# Patient Record
Sex: Female | Born: 1960 | Race: White | Hispanic: No | Smoking: Former smoker
Health system: Southern US, Community
[De-identification: ages and names within clinical notes are randomized; demographics above are authoritative.]

## PROBLEM LIST (undated history)

## (undated) DIAGNOSIS — D649 Anemia, unspecified: Secondary | ICD-10-CM

## (undated) DIAGNOSIS — Z808 Family history of malignant neoplasm of other organs or systems: Secondary | ICD-10-CM

## (undated) DIAGNOSIS — F32A Depression, unspecified: Secondary | ICD-10-CM

## (undated) DIAGNOSIS — Z5189 Encounter for other specified aftercare: Secondary | ICD-10-CM

## (undated) DIAGNOSIS — I10 Essential (primary) hypertension: Secondary | ICD-10-CM

## (undated) DIAGNOSIS — Z801 Family history of malignant neoplasm of trachea, bronchus and lung: Secondary | ICD-10-CM

## (undated) DIAGNOSIS — E162 Hypoglycemia, unspecified: Secondary | ICD-10-CM

## (undated) DIAGNOSIS — F419 Anxiety disorder, unspecified: Secondary | ICD-10-CM

## (undated) DIAGNOSIS — Z803 Family history of malignant neoplasm of breast: Secondary | ICD-10-CM

## (undated) DIAGNOSIS — Z800 Family history of malignant neoplasm of digestive organs: Secondary | ICD-10-CM

## (undated) DIAGNOSIS — Z8043 Family history of malignant neoplasm of testis: Secondary | ICD-10-CM

## (undated) DIAGNOSIS — H269 Unspecified cataract: Secondary | ICD-10-CM

## (undated) DIAGNOSIS — F329 Major depressive disorder, single episode, unspecified: Secondary | ICD-10-CM

## (undated) HISTORY — DX: Family history of malignant neoplasm of trachea, bronchus and lung: Z80.1

## (undated) HISTORY — DX: Unspecified cataract: H26.9

## (undated) HISTORY — DX: Encounter for other specified aftercare: Z51.89

## (undated) HISTORY — DX: Anemia, unspecified: D64.9

## (undated) HISTORY — DX: Anxiety disorder, unspecified: F41.9

## (undated) HISTORY — DX: Family history of malignant neoplasm of breast: Z80.3

## (undated) HISTORY — DX: Family history of malignant neoplasm of testis: Z80.43

## (undated) HISTORY — DX: Depression, unspecified: F32.A

## (undated) HISTORY — DX: Major depressive disorder, single episode, unspecified: F32.9

## (undated) HISTORY — DX: Family history of malignant neoplasm of digestive organs: Z80.0

## (undated) HISTORY — DX: Family history of malignant neoplasm of other organs or systems: Z80.8

## (undated) HISTORY — PX: TUBAL LIGATION: SHX77

## (undated) HISTORY — PX: FRACTURE SURGERY: SHX138

---

## 1985-09-08 HISTORY — PX: SPLENECTOMY, TOTAL: SHX788

## 2004-03-01 ENCOUNTER — Emergency Department (HOSPITAL_COMMUNITY): Admission: EM | Admit: 2004-03-01 | Discharge: 2004-03-01 | Payer: Self-pay | Admitting: Family Medicine

## 2004-05-07 ENCOUNTER — Ambulatory Visit (HOSPITAL_COMMUNITY): Admission: RE | Admit: 2004-05-07 | Discharge: 2004-05-07 | Payer: Self-pay | Admitting: Family Medicine

## 2004-05-14 ENCOUNTER — Ambulatory Visit (HOSPITAL_COMMUNITY): Admission: RE | Admit: 2004-05-14 | Discharge: 2004-05-14 | Payer: Self-pay | Admitting: Internal Medicine

## 2007-05-06 ENCOUNTER — Emergency Department (HOSPITAL_COMMUNITY): Admission: EM | Admit: 2007-05-06 | Discharge: 2007-05-06 | Payer: Self-pay | Admitting: Emergency Medicine

## 2009-06-14 ENCOUNTER — Ambulatory Visit: Payer: Self-pay | Admitting: Obstetrics and Gynecology

## 2009-06-14 ENCOUNTER — Encounter: Payer: Self-pay | Admitting: Obstetrics and Gynecology

## 2009-06-14 LAB — CONVERTED CEMR LAB
FSH: 49.5 milliintl units/mL
HCT: 25.8 % — ABNORMAL LOW (ref 36.0–46.0)
Hemoglobin: 7.2 g/dL — CL (ref 12.0–15.0)
RBC: 3.32 M/uL — ABNORMAL LOW (ref 3.87–5.11)

## 2009-06-15 ENCOUNTER — Ambulatory Visit (HOSPITAL_COMMUNITY): Admission: RE | Admit: 2009-06-15 | Discharge: 2009-06-15 | Payer: Self-pay | Admitting: Obstetrics & Gynecology

## 2009-07-03 ENCOUNTER — Ambulatory Visit (HOSPITAL_COMMUNITY): Admission: RE | Admit: 2009-07-03 | Discharge: 2009-07-03 | Payer: Self-pay | Admitting: Obstetrics & Gynecology

## 2009-07-05 ENCOUNTER — Ambulatory Visit: Payer: Self-pay | Admitting: Obstetrics and Gynecology

## 2010-07-28 ENCOUNTER — Emergency Department (HOSPITAL_COMMUNITY): Admission: EM | Admit: 2010-07-28 | Discharge: 2010-07-28 | Payer: Self-pay | Admitting: Emergency Medicine

## 2010-12-12 LAB — POCT URINALYSIS DIP (DEVICE)
Glucose, UA: NEGATIVE mg/dL
Ketones, ur: NEGATIVE mg/dL
Nitrite: NEGATIVE
Nitrite: POSITIVE — AB
Protein, ur: NEGATIVE mg/dL
Protein, ur: NEGATIVE mg/dL
Specific Gravity, Urine: 1.02 (ref 1.005–1.030)
Urobilinogen, UA: 1 mg/dL (ref 0.0–1.0)
pH: 6 (ref 5.0–8.0)

## 2010-12-15 ENCOUNTER — Other Ambulatory Visit: Payer: Self-pay | Admitting: Family Medicine

## 2010-12-15 ENCOUNTER — Ambulatory Visit (HOSPITAL_BASED_OUTPATIENT_CLINIC_OR_DEPARTMENT_OTHER)
Admission: RE | Admit: 2010-12-15 | Discharge: 2010-12-15 | Disposition: A | Discharge status: Home / Self Care | Payer: Self-pay | Source: Ambulatory Visit | Attending: Family Medicine | Admitting: Family Medicine

## 2010-12-15 ENCOUNTER — Ambulatory Visit (INDEPENDENT_AMBULATORY_CARE_PROVIDER_SITE_OTHER)
Admission: RE | Admit: 2010-12-15 | Discharge: 2010-12-15 | Disposition: A | Discharge status: Home / Self Care | Payer: Self-pay | Source: Ambulatory Visit | Attending: Family Medicine | Admitting: Family Medicine

## 2010-12-15 DIAGNOSIS — K37 Unspecified appendicitis: Secondary | ICD-10-CM

## 2010-12-15 DIAGNOSIS — J984 Other disorders of lung: Secondary | ICD-10-CM | POA: Insufficient documentation

## 2010-12-15 DIAGNOSIS — R188 Other ascites: Secondary | ICD-10-CM | POA: Insufficient documentation

## 2010-12-15 MED ORDER — IOHEXOL 300 MG/ML  SOLN
100.0000 mL | Freq: Once | INTRAMUSCULAR | Status: AC | PRN
  Administered 2010-12-15: 100 mL via INTRAVENOUS

## 2012-06-03 ENCOUNTER — Ambulatory Visit: Payer: Self-pay | Admitting: Family Medicine

## 2012-06-03 VITALS — BP 151/100 | HR 81 | Temp 98.0°F | Resp 18 | Ht 60.0 in | Wt 149.0 lb

## 2012-06-03 DIAGNOSIS — M545 Low back pain, unspecified: Secondary | ICD-10-CM

## 2012-06-03 DIAGNOSIS — M549 Dorsalgia, unspecified: Secondary | ICD-10-CM

## 2012-06-03 MED ORDER — CYCLOBENZAPRINE HCL 10 MG PO TABS: 10.0000 mg | ORAL_TABLET | Freq: Three times a day (TID) | ORAL | Status: DC | PRN

## 2012-06-03 NOTE — Patient Instructions (Addendum)
Keep using the Alleve twice a day for the next several days.   Take the Flexeril at night for relief of your back.   Use heat and massage for relief as well.    Come back to see [place] in 10 - 14 days if you're still having issues, sooner if you're worsening.    Back Pain, Adult Low back pain is very common. About 1 in 5 people have back pain.The cause of low back pain is rarely dangerous. The pain often gets better over time.About half of people with a sudden onset of back pain feel better in just 2 weeks. About 8 in 10 people feel better by 6 weeks.  CAUSES Some common causes of back pain include:  Strain of the muscles or ligaments supporting the spine.   Wear and tear (degeneration) of the spinal discs.   Arthritis.   Direct injury to the back.  DIAGNOSIS Most of the time, the direct cause of low back pain is not known.However, back pain can be treated effectively even when the exact cause of the pain is unknown.Answering your caregiver's questions about your overall health and symptoms is one of the most accurate ways to make sure the cause of your pain is not dangerous. If your caregiver needs more information, he or she may order lab work or imaging tests (X-rays or MRIs).However, even if imaging tests show changes in your back, this usually does not require surgery. HOME CARE INSTRUCTIONS For many people, back pain returns.Since low back pain is rarely dangerous, it is often a condition that people can learn to [facility] their own.   Remain active. It is stressful on the back to sit or stand in one place. Do not sit, drive, or stand in one place for more than 30 minutes at a time. Take short walks on level surfaces as soon as pain allows.Try to increase the length of time you walk each day.   Do not stay in bed.Resting more than 1 or 2 days can delay your recovery.   Do not avoid exercise or work.Your body is made to move.It is not dangerous to be active, even though your  back may hurt.Your back will likely heal faster if you return to being active before your pain is gone.   Pay attention to your body when you bend and lift. Many people have less discomfortwhen lifting if they bend their knees, keep the load close to their bodies,and avoid twisting. Often, the most comfortable positions are those that put less stress on your recovering back.   Find a comfortable position to sleep. Use a firm mattress and lie on your side with your knees slightly bent. If you lie on your back, put a pillow under your knees.   Only take over-the-counter or prescription medicines as directed by your caregiver. Over-the-counter medicines to reduce pain and inflammation are often the most helpful.Your caregiver may prescribe muscle relaxant drugs.These medicines help dull your pain so you can more quickly return to your normal activities and healthy exercise.   Put ice on the injured area.   Put ice in a plastic bag.   Place a towel between your skin and the bag.   Leave the ice on for 15 to 20 minutes, 3 to 4 times a day for the first 2 to 3 days. After that, ice and heat may be alternated to reduce pain and spasms.   Ask your caregiver about trying back exercises and gentle massage. This may be  of some benefit.   Avoid feeling anxious or stressed.Stress increases muscle tension and can worsen back pain.It is important to recognize when you are anxious or stressed and learn ways to manage it.Exercise is a great option.  SEEK MEDICAL CARE IF:  You have pain that is not relieved with rest or medicine.   You have pain that does not improve in 1 week.   You have new symptoms.   You are generally not feeling well.  SEEK IMMEDIATE MEDICAL CARE IF:   You have pain that radiates from your back into your legs.   You develop new bowel or bladder control problems.   You have unusual weakness or numbness in your arms or legs.   You develop nausea or vomiting.   You  develop abdominal pain.   You feel faint.  Document Released: 08/25/2005 Document Revised: 08/14/2011 Document Reviewed: 01/13/2011 [facility] Patient Information 2012 [place], [place].
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
## 2013-08-21 ENCOUNTER — Ambulatory Visit (INDEPENDENT_AMBULATORY_CARE_PROVIDER_SITE_OTHER): Payer: BC Managed Care – PPO | Admitting: Emergency Medicine

## 2013-08-21 VITALS — BP 168/78 | HR 101 | Temp 98.7°F | Resp 30 | Ht 60.5 in | Wt 153.0 lb

## 2013-08-21 DIAGNOSIS — J209 Acute bronchitis, unspecified: Secondary | ICD-10-CM

## 2013-08-21 MED ORDER — HYDROCOD POLST-CHLORPHEN POLST 10-8 MG/5ML PO LQCR: 5.0000 mL | Freq: Two times a day (BID) | ORAL | Status: DC | PRN

## 2013-08-21 NOTE — Patient Instructions (Signed)

## 2013-08-21 NOTE — Progress Notes (Signed)
[facility] [address], [place] [address] [phone]  Date:  08/21/2013   Name:  Melanie Montgomery   DOB:  August 28, 1961   MRN:  [id number]  PCP:  No primary provider on file.    Chief Complaint: Cough and Generalized Body Aches   History of Present Illness:  Melanie Montgomery is a 52 y.o. very pleasant female patient who presents with the following:  Ill for a week with cough and nasal congestion.  Initially mucopurulent sputum but now clear.  Has clear nasal drainage.  No fever or chills. No wheezing or shortness of breath.  No nausea or vomiting.  Had sore throat now resolved. No flu shot.  no myalgias or arthralgias.  No headache.  Cough is quite intense.  No improvement with over the counter medications or other home remedies. Denies other complaint or health concern today.   There are no active problems to display for this patient.   Past Medical History  Diagnosis Date  . Anemia   . Blood transfusion without reported diagnosis     Past Surgical History  Procedure Laterality Date  . Splenectomy, total  1987  . Fracture surgery      History  Substance Use Topics  . Smoking status: Former Smoker -- 2.00 packs/day    Types: Cigarettes    Start date: 08/21/1981    Quit date: 01/02/2013  . Smokeless tobacco: Never Used  . Alcohol Use: Not on file    Family History  Problem Relation Age of Onset  . Cancer Mother   . Diabetes Mother   . Heart disease Father   . Diabetes Sister   . Heart disease Brother   . Diabetes Brother   . Heart disease Brother     No Known Allergies  Medication list has been reviewed and updated.  Current Outpatient Prescriptions on File Prior to Visit  Medication Sig Dispense Refill  . cyclobenzaprine (FLEXERIL) 10 MG tablet Take 1 tablet (10 mg total) by mouth 3 (three) times daily as needed for muscle spasms.  20 tablet  0   No current facility-administered medications on file prior to visit.     Review of Systems:  As per HPI, otherwise negative.    Physical Examination: Filed Vitals:   08/21/13 1256  BP: 168/78  Pulse: 101  Temp: 98.7 F (37.1 C)  Resp: 30   Filed Vitals:   08/21/13 1256  Height: 5' 0.5" (1.537 m)  Weight: 153 lb (69.4 kg)   Body mass index is 29.38 kg/(m^2). Ideal Body Weight: Weight in (lb) to have BMI = 25: 129.9  GEN: WDWN, NAD, Non-toxic, A & O x 3 HEENT: Atraumatic, Normocephalic. Neck supple. No masses, No LAD. Ears and Nose: No external deformity. CV: RRR, No M/G/R. No JVD. No thrill. No extra heart sounds. PULM: CTA B, no wheezes, crackles, rhonchi. No retractions. No resp. distress. No accessory muscle use. ABD: S, NT, ND, +BS. No rebound. No HSM. EXTR: No c/c/e NEURO Normal gait.  PSYCH: Normally interactive. Conversant. Not depressed or anxious appearing.  Calm demeanor.    Assessment and Plan: Viral bronchitis tussionex Follow up as needed  Signed,  Phillips Odor, MD

## 2013-12-12 ENCOUNTER — Ambulatory Visit (INDEPENDENT_AMBULATORY_CARE_PROVIDER_SITE_OTHER): Payer: BC Managed Care – PPO | Admitting: Family Medicine

## 2013-12-12 VITALS — BP 148/96 | HR 85 | Temp 99.2°F | Resp 16 | Ht 60.5 in | Wt 152.0 lb

## 2013-12-12 DIAGNOSIS — S0502XA Injury of conjunctiva and corneal abrasion without foreign body, left eye, initial encounter: Secondary | ICD-10-CM

## 2013-12-12 DIAGNOSIS — S058X9A Other injuries of unspecified eye and orbit, initial encounter: Secondary | ICD-10-CM

## 2013-12-12 DIAGNOSIS — H5710 Ocular pain, unspecified eye: Secondary | ICD-10-CM

## 2013-12-12 MED ORDER — ERYTHROMYCIN 5 MG/GM OP OINT: 1.0000 "application " | TOPICAL_OINTMENT | Freq: Four times a day (QID) | OPHTHALMIC | Status: DC

## 2013-12-12 NOTE — Patient Instructions (Signed)
Corneal Abrasion  The cornea is the clear covering at the front and center of the eye. When looking at the colored portion of the eye (iris), you are looking through the cornea. This very thin tissue is made up of many layers. The surface layer is a single layer of cells (corneal epithelium) and is one of the most sensitive tissues in the body. If a scratch or injury causes the corneal epithelium to come off, it is called a corneal abrasion. If the injury extends to the tissues below the epithelium, the condition is called a corneal ulcer.  CAUSES    Scratches.   Trauma.   Foreign body in the eye.  Some people have recurrences of abrasions in the area of the original injury even after it has healed (recurrent erosion syndrome). Recurrent erosion syndrome generally improves and goes away with time.  SYMPTOMS    Eye pain.   Difficulty or inability to keep the injured eye open.   The eye becomes very sensitive to light.   Recurrent erosions tend to happen suddenly, first thing in the morning, usually after waking up and opening the eye.  DIAGNOSIS   Your health care provider can diagnose a corneal abrasion during an eye exam. Dye is usually placed in the eye using a drop or a small paper strip moistened by your tears. When the eye is examined with a special light, the abrasion shows up clearly because of the dye.  TREATMENT    Small abrasions may be treated with antibiotic drops or ointment alone.   Usually a pressure patch is specially applied. Pressure patches prevent the eye from blinking, allowing the corneal epithelium to heal. A pressure patch also reduces the amount of pain present in the eye during healing. Most corneal abrasions heal within 2 3 days with no effect on vision.  If the abrasion becomes infected and spreads to the deeper tissues of the cornea, a corneal ulcer can result. This is serious because it can cause corneal scarring. Corneal scars interfere with light passing through the cornea  and cause a loss of vision in the involved eye.  HOME CARE INSTRUCTIONS   Use medicine or ointment as directed. Only take over-the-counter or prescription medicines for pain, discomfort, or fever as directed by your health care provider.   Do not drive or operate machinery while your eye is patched. Your ability to judge distances is impaired.   If your health care provider has given you a follow-up appointment, it is very important to keep that appointment. Not keeping the appointment could result in a severe eye infection or permanent loss of vision. If there is any problem keeping the appointment, let your health care provider know.  SEEK MEDICAL CARE IF:    You have pain, light sensitivity, and a scratchy feeling in one eye or both eyes.   Your pressure patch keeps loosening up, and you can blink your eye under the patch after treatment.   Any kind of discharge develops from the eye after treatment or if the lids stick together in the morning.   You have the same symptoms in the morning as you did with the original abrasion days, weeks, or months after the abrasion healed.  MAKE SURE YOU:    Understand these instructions.   Will watch your condition.   Will get help right away if you are not doing well or get worse.  Document Released: 08/22/2000 Document Revised: 06/15/2013 Document Reviewed: 05/02/2013  [employer] Patient Information   2014 [employer].

## 2013-12-12 NOTE — Progress Notes (Signed)
Chief Complaint:  Chief Complaint  Patient presents with  . Eye Injury    left, today, poked by stick, blurred vision    HPI: Melanie Montgomery is a 53 y.o. female who is here for  Left eye pain since 3 pm, she was working out in yard and was turning her head to dump some yard rubbish when  stick in the garbage stuck her in the eye and she ahs pain ,w atery eyes since then. She feels like there is debris underneath her eyelid . + light sensitivity. Has had blurred vision.Denies  nausea, vomiting, HAs, double vision  Past Medical History  Diagnosis Date  . Anemia   . Blood transfusion without reported diagnosis    Past Surgical History  Procedure Laterality Date  . Splenectomy, total  1987  . Fracture surgery     History   Social History  . Marital Status: Divorced    Spouse Name: N/A    Number of Children: N/A  . Years of Education: N/A   Social History Main Topics  . Smoking status: Current Every Day Smoker -- 2.00 packs/day    Types: Cigarettes    Start date: 08/21/1981    Last Attempt to Quit: 01/02/2013  . Smokeless tobacco: Never Used  . Alcohol Use: None  . Drug Use: None  . Sexual Activity: None   Other Topics Concern  . None   Social History Narrative  . None   Family History  Problem Relation Age of Onset  . Cancer Mother   . Diabetes Mother   . Heart disease Father   . Diabetes Sister   . Heart disease Brother   . Diabetes Brother   . Heart disease Brother    No Known Allergies Prior to Admission medications   Medication Sig Start Date End Date Taking? Authorizing Provider  chlorpheniramine-HYDROcodone (TUSSIONEX PENNKINETIC ER) 10-8 MG/5ML LQCR Take 5 mLs by mouth every 12 (twelve) hours as needed. 08/21/13   Ellison Carwin, MD  cyclobenzaprine (FLEXERIL) 10 MG tablet Take 1 tablet (10 mg total) by mouth 3 (three) times daily as needed for muscle spasms. 06/03/12   Alveda Reasons, MD     ROS: The patient denies fevers, chills, night  sweats, unintentional weight loss, chest pain, palpitations, wheezing, dyspnea on exertion, nausea, vomiting, abdominal pain, dysuria, hematuria, melena, numbness, weakness, or tingling.   All other systems have been reviewed and were otherwise negative with the exception of those mentioned in the HPI and as above.    PHYSICAL EXAM: Filed Vitals:   12/12/13 1911  BP: 148/96  Pulse: 85  Temp: 99.2 F (37.3 C)  Resp: 16   Filed Vitals:   12/12/13 1911  Height: 5' 0.5" (1.537 m)  Weight: 152 lb (68.947 kg)   Body mass index is 29.19 kg/(m^2).  General: Alert, no acute distress HEENT:  Normocephalic, atraumatic, oropharynx patent. EOMI, PERRLA, + corneal uptake of fluoroscein dye, at 6 oclock below pupil, fundo exam nl  Cardiovascular:  Regular rate and rhythm, no rubs murmurs or gallops.  No Carotid bruits, radial pulse intact. No pedal edema.  Respiratory: Clear to auscultation bilaterally.  No wheezes, rales, or rhonchi.  No cyanosis, no use of accessory musculature GI: No organomegaly, abdomen is soft and non-tender, positive bowel sounds.  No masses. Skin: No rashes. Neurologic: Facial musculature symmetric. Psychiatric: Patient is appropriate throughout our interaction. Lymphatic: No cervical lymphadenopathy Musculoskeletal: Gait intact.   LABS: Results for orders placed in  visit on 07/05/09  POCT URINALYSIS DIP (DEVICE)      Result Value Ref Range   Glucose, UA NEGATIVE  NEGATIVE mg/dL   Bilirubin Urine SMALL (*) NEGATIVE   Ketones, ur NEGATIVE  NEGATIVE mg/dL   Specific Gravity, Urine 1.020  1.005 - 1.030   Hgb urine dipstick MODERATE (*) NEGATIVE   pH 7.0  5.0 - 8.0   Protein, ur NEGATIVE  NEGATIVE mg/dL   Urobilinogen, UA 1.0  0.0 - 1.0 mg/dL   Nitrite NEGATIVE  NEGATIVE   Leukocytes, UA    NEGATIVE   Value: NEGATIVE Biochemical Testing Only. Please order routine urinalysis from main lab if confirmatory testing is needed.     EKG/XRAY:   Primary read  interpreted by Dr. Marin Comment at [facility].   ASSESSMENT/PLAN: Encounter Diagnoses  Name Primary?  . Corneal abrasion, left Yes  . Eye pain    Refer to optho Rx erythromycin opthalmic ointment QID F/u prn or worsening sxs Advise to call if she does not get appt in 1-2 days  Gross sideeffects, risk and benefits, and alternatives of medications d/w patient. Patient is aware that all medications have potential sideeffects and we are unable to predict every sideeffect or drug-drug interaction that may occur.  LE, [place], DO 12/12/2013 8:27 PM

## 2014-06-17 ENCOUNTER — Ambulatory Visit (INDEPENDENT_AMBULATORY_CARE_PROVIDER_SITE_OTHER): Payer: BC Managed Care – PPO | Admitting: Emergency Medicine

## 2014-06-17 VITALS — BP 152/78 | HR 81 | Temp 98.5°F | Resp 12 | Ht 60.5 in | Wt 152.1 lb

## 2014-06-17 DIAGNOSIS — R55 Syncope and collapse: Secondary | ICD-10-CM

## 2014-06-17 DIAGNOSIS — N3090 Cystitis, unspecified without hematuria: Secondary | ICD-10-CM

## 2014-06-17 LAB — POCT CBC
Granulocyte percent: 51.8 %G (ref 37–80)
HCT, POC: 43.2 % (ref 37.7–47.9)
HEMOGLOBIN: 14.3 g/dL (ref 12.2–16.2)
LYMPH, POC: 4.2 — AB (ref 0.6–3.4)
MCH: 30.8 pg (ref 27–31.2)
MCHC: 33.1 g/dL (ref 31.8–35.4)
MCV: 93 fL (ref 80–97)
MID (CBC): 0.8 (ref 0–0.9)
MPV: 8.4 fL (ref 0–99.8)
POC Granulocyte: 5.3 (ref 2–6.9)
POC LYMPH %: 40.3 % (ref 10–50)
POC MID %: 7.9 % (ref 0–12)
Platelet Count, POC: 383 10*3/uL (ref 142–424)
RBC: 4.65 M/uL (ref 4.04–5.48)
RDW, POC: 13.4 %
WBC: 10.3 10*3/uL — AB (ref 4.6–10.2)

## 2014-06-17 LAB — POCT UA - MICROSCOPIC ONLY
CASTS, UR, LPF, POC: NEGATIVE
Crystals, Ur, HPF, POC: NEGATIVE
MUCUS UA: NEGATIVE
RBC, urine, microscopic: 2.5
WBC, Ur, HPF, POC: NEGATIVE
YEAST UA: NEGATIVE

## 2014-06-17 LAB — COMPREHENSIVE METABOLIC PANEL
ALK PHOS: 104 U/L (ref 39–117)
ALT: 17 U/L (ref 0–35)
AST: 17 U/L (ref 0–37)
Albumin: 4.3 g/dL (ref 3.5–5.2)
BUN: 11 mg/dL (ref 6–23)
CO2: 25 mEq/L (ref 19–32)
CREATININE: 0.55 mg/dL (ref 0.50–1.10)
Calcium: 10.3 mg/dL (ref 8.4–10.5)
Chloride: 104 mEq/L (ref 96–112)
Glucose, Bld: 86 mg/dL (ref 70–99)
Potassium: 4.7 mEq/L (ref 3.5–5.3)
Sodium: 139 mEq/L (ref 135–145)
Total Bilirubin: 0.4 mg/dL (ref 0.2–1.2)
Total Protein: 7.3 g/dL (ref 6.0–8.3)

## 2014-06-17 LAB — POCT URINALYSIS DIPSTICK
Bilirubin, UA: NEGATIVE
GLUCOSE UA: NEGATIVE
Ketones, UA: NEGATIVE
Leukocytes, UA: NEGATIVE
NITRITE UA: NEGATIVE
PH UA: 5.5
Protein, UA: NEGATIVE
Spec Grav, UA: 1.02
UROBILINOGEN UA: 0.2

## 2014-06-17 MED ORDER — CIPROFLOXACIN HCL 500 MG PO TABS: 500.0000 mg | ORAL_TABLET | Freq: Two times a day (BID) | ORAL | Status: DC

## 2014-06-17 NOTE — Addendum Note (Signed)
Addended by: Roselee Culver on: 06/17/2014 02:56 PM   Modules accepted: Orders

## 2014-06-17 NOTE — Patient Instructions (Signed)

## 2014-06-17 NOTE — Progress Notes (Signed)
[facility] [address], [place] [address] [phone]  Date:  06/17/2014   Name:  Melanie Montgomery   DOB:  Jan 05, 1961   MRN:  [id number]  PCP:  No primary provider on file.    Chief Complaint: [place] Work [employer].   History of Present Illness:  Melanie Montgomery is a 53 y.o. very pleasant female patient who presents with the following:  Patient at work yesterday and was walking.  Felt as though she would pass out and fall to the ground.  She has no antecedent illness or injury. Grabbed the wall to keep from falling.  She drank some orange juice and felt improved.   Eating normally.  No nausea or vomitting, no stool change.  No fever or chills  No cough or coryza. No wheezing or shortness of breath.  Some difficulty with ambulation due unsteadiynss.  No difficulty with speech or expression.  No headache. No improvement with over the counter medications or other home remedies. Denies other complaint or health concern today.   There are no active problems to display for this patient.   Past Medical History  Diagnosis Date  . Anemia   . Blood transfusion without reported diagnosis     Past Surgical History  Procedure Laterality Date  . Splenectomy, total  1987  . Fracture surgery      History  Substance Use Topics  . Smoking status: Former Smoker -- 2.00 packs/day    Types: Cigarettes    Start date: 08/21/1981    Quit date: 01/02/2013  . Smokeless tobacco: Never Used  . Alcohol Use: No    Family History  Problem Relation Age of Onset  . Cancer Mother   . Diabetes Mother   . Heart disease Father   . Diabetes Sister   . Heart disease Brother   . Diabetes Brother   . Heart disease Brother     No Known Allergies  Medication list has been reviewed and updated.  No current outpatient prescriptions on file prior to visit.   No current facility-administered medications on file prior to visit.    Review of  Systems:  As per HPI, otherwise negative.    Physical Examination: Filed Vitals:   06/17/14 1258  BP: 152/78  Pulse: 81  Temp: 98.5 F (36.9 C)  Resp: 12   Filed Vitals:   06/17/14 1258  Height: 5' 0.5" (1.537 m)  Weight: 152 lb 2 oz (69.003 kg)   Body mass index is 29.21 kg/(m^2). Ideal Body Weight: Weight in (lb) to have BMI = 25: 129.9  GEN: WDWN, NAD, Non-toxic, A & O x 3 HEENT: Atraumatic, Normocephalic. Neck supple. No masses, No LAD. Ears and Nose: No external deformity. CV: RRR, No M/G/R. No JVD. No thrill. No extra heart sounds. PULM: CTA B, no wheezes, crackles, rhonchi. No retractions. No resp. distress. No accessory muscle use. ABD: S, NT, ND, +BS. No rebound. No HSM. EXTR: No c/c/e NEURO Normal gait. Romberg and tandem intact PSYCH: Normally interactive. Conversant. Not depressed or anxious appearing.  Calm demeanor.    Assessment and Plan: Vertigo  Signed,  Ellison Carwin, MD   Results for orders placed in visit on 06/17/14  POCT CBC      Result Value Ref Range   WBC 10.3 (*) 4.6 - 10.2 K/uL   Lymph, poc 4.2 (*) 0.6 - 3.4   POC LYMPH PERCENT 40.3  10 - 50 %L   MID (cbc) 0.8  0 - 0.9   POC MID % 7.9  0 - 12 %M   POC Granulocyte 5.3  2 - 6.9   Granulocyte percent 51.8  37 - 80 %G   RBC 4.65  4.04 - 5.48 M/uL   Hemoglobin 14.3  12.2 - 16.2 g/dL   HCT, POC 43.2  37.7 - 47.9 %   MCV 93.0  80 - 97 fL   MCH, POC 30.8  27 - 31.2 pg   MCHC 33.1  31.8 - 35.4 g/dL   RDW, POC 13.4     Platelet Count, POC 383  142 - 424 K/uL   MPV 8.4  0 - 99.8 fL  POCT UA - MICROSCOPIC ONLY      Result Value Ref Range   WBC, Ur, HPF, POC neg     RBC, urine, microscopic 2.5     Bacteria, U Microscopic 1+     Mucus, UA neg     Epithelial cells, urine per micros 3-4     Crystals, Ur, HPF, POC neg     Casts, Ur, LPF, POC neg     Yeast, UA neg    POCT URINALYSIS DIPSTICK      Result Value Ref Range   Color, UA yellow     Clarity, UA clear     Glucose, UA neg      Bilirubin, UA neg     Ketones, UA neg     Spec Grav, UA 1.020     Blood, UA mod     pH, UA 5.5     Protein, UA neg     Urobilinogen, UA 0.2     Nitrite, UA neg     Leukocytes, UA Negative

## 2014-06-18 LAB — TSH: TSH: 1.985 u[IU]/mL (ref 0.350–4.500)

## 2014-12-28 ENCOUNTER — Ambulatory Visit (INDEPENDENT_AMBULATORY_CARE_PROVIDER_SITE_OTHER): Payer: 59 | Admitting: Emergency Medicine

## 2014-12-28 VITALS — BP 140/76 | HR 86 | Temp 98.4°F | Resp 16 | Ht 60.25 in | Wt 150.4 lb

## 2014-12-28 DIAGNOSIS — IMO0001 Reserved for inherently not codable concepts without codable children: Secondary | ICD-10-CM

## 2014-12-28 DIAGNOSIS — R350 Frequency of micturition: Secondary | ICD-10-CM | POA: Diagnosis not present

## 2014-12-28 DIAGNOSIS — J020 Streptococcal pharyngitis: Secondary | ICD-10-CM

## 2014-12-28 LAB — POCT URINALYSIS DIPSTICK
Bilirubin, UA: NEGATIVE
GLUCOSE UA: NEGATIVE
LEUKOCYTES UA: NEGATIVE
NITRITE UA: NEGATIVE
SPEC GRAV UA: 1.02
Urobilinogen, UA: >=8
pH, UA: 7.5

## 2014-12-28 LAB — POCT UA - MICROSCOPIC ONLY
Bacteria, U Microscopic: NEGATIVE
CASTS, UR, LPF, POC: NEGATIVE
Crystals, Ur, HPF, POC: NEGATIVE
Mucus, UA: NEGATIVE
YEAST UA: NEGATIVE

## 2014-12-28 MED ORDER — PENICILLIN V POTASSIUM 500 MG PO TABS: 500.0000 mg | ORAL_TABLET | Freq: Four times a day (QID) | ORAL | Status: DC

## 2014-12-28 NOTE — Patient Instructions (Signed)

## 2014-12-28 NOTE — Progress Notes (Signed)
[facility] [address], [place] [address] [phone]  Date:  12/28/2014   Name:  Melanie Montgomery   DOB:  January 05, 1961   MRN:  [id number]  PCP:  No primary care provider on file.    Chief Complaint: Sore Throat; Headache; Chills; Generalized Body Aches; Back Pain; Fever; and Depression   History of Present Illness:  Melanie Montgomery is a 54 y.o. very pleasant female patient who presents with the following:  Ill since Sunday with fever and sore throat Now has malaise, myalgias, and fatigue. No cough wheezing or shortness of breath No nasal congestion or drainage. Temp to 102 No nausea or vomiting No rash Has some frequent urination.  No urgency or dysuria Wants UA No improvement with over the counter medications or other home remedies.  Denies other complaint or health concern today.   There are no active problems to display for this patient.   Past Medical History  Diagnosis Date  . Anemia   . Blood transfusion without reported diagnosis     Past Surgical History  Procedure Laterality Date  . Splenectomy, total  1987  . Fracture surgery      History  Substance Use Topics  . Smoking status: Former Smoker -- 2.00 packs/day    Types: Cigarettes    Start date: 08/21/1981    Quit date: 01/02/2013  . Smokeless tobacco: Never Used  . Alcohol Use: No    Family History  Problem Relation Age of Onset  . Cancer Mother   . Diabetes Mother   . Heart disease Father   . Diabetes Sister   . Heart disease Brother   . Diabetes Brother   . Heart disease Brother     No Known Allergies  Medication list has been reviewed and updated.  No current outpatient prescriptions on file prior to visit.   No current facility-administered medications on file prior to visit.    Review of Systems:  As per HPI, otherwise negative.    Physical Examination: Filed Vitals:   12/28/14 1438  BP: 140/76  Pulse: 86  Temp: 98.4 F (36.9 C)  Resp: 16    Filed Vitals:   12/28/14 1438  Height: 5' 0.25" (1.53 m)  Weight: 150 lb 6.4 oz (68.221 kg)   Body mass index is 29.14 kg/(m^2). Ideal Body Weight: Weight in (lb) to have BMI = 25: 128.8  GEN: WDWN, NAD, Non-toxic, A & O x 3 HEENT: Atraumatic, Normocephalic. Neck supple. No masses, No LAD.  Exudative pharyngitis Ears and Nose: No external deformity. CV: RRR, No M/G/R. No JVD. No thrill. No extra heart sounds. PULM: CTA B, no wheezes, crackles, rhonchi. No retractions. No resp. distress. No accessory muscle use. ABD: S, NT, ND, +BS. No rebound. No HSM. EXTR: No c/c/e NEURO Normal gait.  PSYCH: Normally interactive. Conversant. Not depressed or anxious appearing.  Calm demeanor.    Assessment and Plan: Tonsillitis Pen vk  Signed,  Ellison Carwin, MD   Results for orders placed or performed in visit on 12/28/14  POCT urinalysis dipstick  Result Value Ref Range   Color, UA yellow    Clarity, UA cloudy    Glucose, UA neg    Bilirubin, UA neg    Ketones, UA trace    Spec Grav, UA 1.020    Blood, UA moderate    pH, UA 7.5    Protein, UA trace    Urobilinogen, UA >=8.0    Nitrite, UA neg  Leukocytes, UA Negative   POCT UA - Microscopic Only  Result Value Ref Range   WBC, Ur, HPF, POC 0-3    RBC, urine, microscopic 0-6    Bacteria, U Microscopic neg    Mucus, UA neg    Epithelial cells, urine per micros 1-4    Crystals, Ur, HPF, POC neg    Casts, Ur, LPF, POC neg    Yeast, UA neg    Amorphous moderate

## 2015-08-20 ENCOUNTER — Ambulatory Visit (INDEPENDENT_AMBULATORY_CARE_PROVIDER_SITE_OTHER): Payer: 59 | Admitting: Internal Medicine

## 2015-08-20 VITALS — BP 140/80 | HR 76 | Temp 98.5°F | Resp 14 | Ht 62.0 in | Wt 156.6 lb

## 2015-08-20 DIAGNOSIS — M545 Low back pain, unspecified: Secondary | ICD-10-CM

## 2015-08-20 DIAGNOSIS — Z23 Encounter for immunization: Secondary | ICD-10-CM | POA: Diagnosis not present

## 2015-08-20 DIAGNOSIS — F329 Major depressive disorder, single episode, unspecified: Secondary | ICD-10-CM

## 2015-08-20 MED ORDER — MELOXICAM 15 MG PO TABS: 15.0000 mg | ORAL_TABLET | Freq: Every day | ORAL | Status: DC

## 2015-08-20 MED ORDER — CYCLOBENZAPRINE HCL 10 MG PO TABS: 10.0000 mg | ORAL_TABLET | Freq: Every day | ORAL | Status: DC

## 2015-08-20 MED ORDER — DIAZEPAM 5 MG PO TABS: 5.0000 mg | ORAL_TABLET | Freq: Every evening | ORAL | Status: DC | PRN
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
## 2015-08-20 NOTE — Patient Instructions (Signed)
TREATMENT  Treatment first involves the use of ice and medicine, to reduce pain and inflammation. The use of strengthening and stretching exercises may help reduce pain with activity. These exercises may be performed at home or with a therapist. Severe injuries may require referral to a therapist for further evaluation and treatment, such as ultrasound. Your caregiver may advise that you wear a back brace or corset, to help reduce pain and discomfort. Often, prolonged bed rest results in greater harm then benefit.  It is important to avoid using your back when lifting objects. At night, sleep on your back on a firm mattress, with a pillow placed under your knees.   HEAT AND COLD  Cold treatment (icing) should be applied for 10 to 15 minutes every 2 to 3 hours for inflammation and pain, and immediately after activity that aggravates your symptoms. Use ice packs or an ice massage.  Heat treatment may be used before performing stretching and strengthening activities prescribed by your caregiver, physical therapist, or athletic trainer. Use a heat pack or a warm water soak.   EXERCISES  RANGE OF MOTION (ROM) AND STRETCHING EXERCISES - Low Back Sprain Most people with lower back pain will find that their symptoms get worse with excessive bending forward (flexion) or arching at the lower back (extension). The exercises that will help resolve your symptoms will focus on the opposite motion.  Your physician, physical therapist or athletic trainer will help you determine which exercises will be most helpful to resolve your lower back pain. Do not complete any exercises without first consulting with your caregiver. Discontinue any exercises which make your symptoms worse, until you speak to your caregiver. If you have pain, numbness or tingling which travels down into your buttocks, leg or foot, the goal of the therapy is for these symptoms to move closer to your back and eventually resolve. Sometimes,  these leg symptoms will get better, but your lower back pain may worsen. This is often an indication of progress in your rehabilitation. Be very alert to any changes in your symptoms and the activities in which you participated in the 24 hours prior to the change. Sharing this information with your caregiver will allow him or her to most efficiently treat your condition. These exercises may help you when beginning to rehabilitate your injury. Your symptoms may resolve with or without further involvement from your physician, physical therapist or athletic trainer. While completing these exercises, remember:   Restoring tissue flexibility helps normal motion to return to the joints. This allows healthier, less painful movement and activity.  An effective stretch should be held for at least 30 seconds.  A stretch should never be painful. You should only feel a gentle lengthening or release in the stretched tissue. FLEXION RANGE OF MOTION AND STRETCHING EXERCISES: STRETCH - Flexion, Single Knee to Chest   Lie on a firm bed or floor with both legs extended in front of you.  Keeping one leg in contact with the floor, bring your opposite knee to your chest. Hold your leg in place by either grabbing behind your thigh or at your knee.  Pull until you feel a gentle stretch in your low back. Hold __________ seconds.  Slowly release your grasp and repeat the exercise with the opposite side. Repeat __________ times. Complete this exercise __________ times per day.  STRETCH - Flexion, Double Knee to Chest  Lie on a firm bed or floor with both legs extended in front of you.  Keeping one leg in contact with the floor, bring your opposite knee to your chest.  Tense your stomach muscles to support your back and then lift your other knee to your chest. Hold your legs in place by either grabbing behind your thighs or at your knees.  Pull both knees toward your chest until you feel a gentle stretch in your low  back. Hold __________ seconds.  Tense your stomach muscles and slowly return one leg at a time to the floor. Repeat __________ times. Complete this exercise __________ times per day.  STRETCH - Low Trunk Rotation  Lie on a firm bed or floor. Keeping your legs in front of you, bend your knees so they are both pointed toward the ceiling and your feet are flat on the floor.  Extend your arms out to the side. This will stabilize your upper body by keeping your shoulders in contact with the floor.  Gently and slowly drop both knees together to one side until you feel a gentle stretch in your low back. Hold for __________ seconds.  Tense your stomach muscles to support your lower back as you bring your knees back to the starting position. Repeat the exercise to the other side. Repeat __________ times. Complete this exercise __________ times per day  EXTENSION RANGE OF MOTION AND FLEXIBILITY EXERCISES: STRETCH - Extension, Prone on Elbows   Lie on your stomach on the floor, a bed will be too soft. Place your palms about shoulder width apart and at the height of your head.  Place your elbows under your shoulders. If this is too painful, stack pillows under your chest.  Allow your body to relax so that your hips drop lower and make contact more completely with the floor.  Hold this position for __________ seconds.  Slowly return to lying flat on the floor. Repeat __________ times. Complete this exercise __________ times per day.  RANGE OF MOTION - Extension, Prone Press Ups  Lie on your stomach on the floor, a bed will be too soft. Place your palms about shoulder width apart and at the height of your head.  Keeping your back as relaxed as possible, slowly straighten your elbows while keeping your hips on the floor. You may adjust the placement of your hands to maximize your comfort. As you gain motion, your hands will come more underneath your shoulders.  Hold this position __________  seconds.  Slowly return to lying flat on the floor. Repeat __________ times. Complete this exercise __________ times per day.  RANGE OF MOTION- Quadruped, Neutral Spine   Assume a hands and knees position on a firm surface. Keep your hands under your shoulders and your knees under your hips. You may place padding under your knees for comfort.  Drop your head and point your tailbone toward the ground below you. This will round out your lower back like an angry cat. Hold this position for __________ seconds.  Slowly lift your head and release your tail bone so that your back sags into a large arch, like an old horse.  Hold this position for __________ seconds.  Repeat this until you feel limber in your low back.  Now, find your "sweet spot." This will be the most comfortable position somewhere between the two previous positions. This is your neutral spine. Once you have found this position, tense your stomach muscles to support your low back.  Hold this position for __________ seconds. Repeat __________ times. Complete this exercise __________ times per day.

## 2016-10-06 ENCOUNTER — Ambulatory Visit: Payer: Self-pay

## 2016-12-10 DIAGNOSIS — H353121 Nonexudative age-related macular degeneration, left eye, early dry stage: Secondary | ICD-10-CM | POA: Diagnosis not present

## 2016-12-10 DIAGNOSIS — H1851 Endothelial corneal dystrophy: Secondary | ICD-10-CM | POA: Diagnosis not present

## 2016-12-10 DIAGNOSIS — H3562 Retinal hemorrhage, left eye: Secondary | ICD-10-CM | POA: Diagnosis not present

## 2017-03-23 ENCOUNTER — Ambulatory Visit (INDEPENDENT_AMBULATORY_CARE_PROVIDER_SITE_OTHER): Payer: BLUE CROSS/BLUE SHIELD | Admitting: Physician Assistant

## 2017-03-23 ENCOUNTER — Encounter: Payer: Self-pay | Admitting: Physician Assistant

## 2017-03-23 VITALS — BP 167/82 | HR 76 | Temp 99.0°F | Resp 18 | Ht 62.0 in | Wt 155.0 lb

## 2017-03-23 DIAGNOSIS — F329 Major depressive disorder, single episode, unspecified: Secondary | ICD-10-CM | POA: Insufficient documentation

## 2017-03-23 DIAGNOSIS — R252 Cramp and spasm: Secondary | ICD-10-CM | POA: Diagnosis not present

## 2017-03-23 DIAGNOSIS — Z131 Encounter for screening for diabetes mellitus: Secondary | ICD-10-CM | POA: Diagnosis not present

## 2017-03-23 DIAGNOSIS — G4700 Insomnia, unspecified: Secondary | ICD-10-CM | POA: Insufficient documentation

## 2017-03-23 DIAGNOSIS — R030 Elevated blood-pressure reading, without diagnosis of hypertension: Secondary | ICD-10-CM | POA: Insufficient documentation

## 2017-03-23 LAB — GLUCOSE, POCT (MANUAL RESULT ENTRY): POC Glucose: 89 mg/dl (ref 70–99)

## 2017-03-23 NOTE — Progress Notes (Signed)
 Melanie Montgomery  MRN: [id number] DOB: 06/08/1961  PCP: Weber, Sarah L, PA-C  Chief Complaint  Patient presents with  . Establish Care    hasn't been diagnosed with DM but would like to be checked   . Depression    SCORE WAS A 19 WOULD LIKE A REFFERAL TO THERAPIST     Subjective:  Pt presents to clinic for several issues and she wants to establish care with a new provider. 1- wants to check her blood sugar - she has had some issues where she feels like her blood sugar is low but she has just eaten - then she felt bad one day with increased thirst and someone told her she might have DM - she would like to get checked.  She has family h/o diabetes and significant cardiac history.  She ate last about 4.5 hours ago.  She has had a few episode of being more thirsty.  Some increase in urination. 2- some cramps in her calves over the last several days - her water intake over those days has been less 3- problems with depression - Oct 2015 - mom passed away, 3 weeks later her sister she looked up to died, then about 6 months later her long term boyfriend died then a few weeks later several of her friends died -- no social support - only 1 friend left - daughters do not understand what she is going through - uses valium rarely - only when she feels out of control - cries a lot some days - feels sad many days - Sleep is not good - trouble getting to sleep but when she gets over tired she gets to sleep but then has trouble staying asleep - she will sometimes take naps but that does not make her sleep worse that night. Grand-daughter (19 y/o) - trouble with drugs and currently in rehab and she is having trouble with her at home which has been really stressful.  Drinks during the day - cup of coffee in the am, part of a Can of Coke during the day, a few bottles of water at work, some Mt Dew in the evening Eat - breakfast - cheese crackers, lunch fruits and veggies - mayo based salad, frozen or fresh veggies  for dinner (no canned veggies)  Review of Systems  Constitutional: Negative for chills and fever.  HENT: Negative.   Cardiovascular: Negative for chest pain and palpitations.  Gastrointestinal: Negative.   Endocrine: Positive for polyphagia (intermittent) and polyuria.  Musculoskeletal:       Leg cramps - intermittent  Psychiatric/Behavioral: Positive for decreased concentration, dysphoric mood and sleep disturbance. The patient is nervous/anxious.     Patient Active Problem List   Diagnosis Date Noted  . 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 Elevated BP without diagnosis of hypertension 03/23/2017    Current Outpatient Prescriptions on File Prior to Visit  Medication Sig Dispense Refill  . diazepam (VALIUM) 5 MG tablet Take 1 tablet (5 mg total) by mouth at bedtime as needed for anxiety. 30 tablet 2   No current facility-administered medications on file prior to visit.     No Known Allergies  Pt patients past, family and social history were reviewed and updated.   Objective:  BP (!) 167/82   Pulse 76   Temp 99 F (37.2 C) (Oral)   Resp 18   Ht 5' 2" (1.575 m)   Wt 155 lb (70.3 kg)   SpO2 96%   BMI 28.35 kg/m     Physical Exam  Constitutional: She is oriented to person, place, and time and well-developed, well-nourished, and in no distress.  HENT:  Head: Normocephalic and atraumatic.  Right Ear: Hearing and external ear normal.  Left Ear: Hearing and external ear normal.  Eyes: Conjunctivae are normal.  Neck: Normal range of motion.  Cardiovascular: Normal rate, regular rhythm and normal heart sounds.   No murmur heard. Pulmonary/Chest: Effort normal and breath sounds normal. She has no wheezes.  Musculoskeletal:       Right lower leg: Normal. She exhibits no tenderness and no edema.       Left lower leg: Normal. She exhibits no tenderness and no edema.  Neurological: She is alert and oriented to person, place, and time. Gait normal.  Skin: Skin is warm and dry.    Psychiatric: Mood, memory, affect and judgment normal.  Vitals reviewed.   Results for orders placed or performed in visit on 03/23/17  POCT glucose (manual entry)  Result Value Ref Range   POC Glucose 89 70 - 99 mg/dl    Assessment and Plan :  Leg cramps - Plan: CMP14+EGFR - check labs  Screening for diabetes mellitus (DM) - Plan: CMP14+EGFR, Hemoglobin A1c, POCT glucose (manual entry) - glucose is normal on today reading - she will monitor her symptoms and if she continues to have these symptoms she will need a glucose monitor to keep track.  Elevated BP without diagnosis of hypertension - recheck is still high - she will recheck in 2 weeks - if still elevated at that time she will need BP medications.  She has significant family history of cardiac issues - she will recheck with me and if treatment is needed we will restart - she has not had a CPE and she will plan on having that in an upcoming appt.  Reactive depression - pt is not interested in medications at this time - she is interested in therapy at this time to help with her grief.  She will start therapy and then recheck with me to make sure the sleep is improved.  She is also having increase stress with her granddaughter currently.  Insomnia - related to reactive depression and family stress - good sleep hygiene discussed  Windell Hummingbird PA-C  [facility] 03/23/2017 5:42 PM

## 2017-03-23 NOTE — Patient Instructions (Addendum)
For therapy -- [facility] [place] - [phone]  My[employer] - APP for monitoring your food and beverage intake You need at least 80g of protein a day   IF you received an x-ray today, you will receive an invoice from [facility]. Please contact [facility] at [phone] with questions or concerns regarding your invoice.   IF you received labwork today, you will receive an invoice from [facility]. Please contact [facility] at [phone] with questions or concerns regarding your invoice.   Our billing staff will not be able to assist you with questions regarding bills from these companies.  You will be contacted with the lab results as soon as they are available. The fastest way to get your results is to activate your My Chart account. Instructions are located on the last page of this paperwork. If you have not heard from [place] regarding the results in 2 weeks, please contact this office.

## 2017-03-24 LAB — CMP14+EGFR
ALBUMIN: 4.3 g/dL (ref 3.5–5.5)
ALK PHOS: 112 IU/L (ref 39–117)
ALT: 24 IU/L (ref 0–32)
AST: 19 IU/L (ref 0–40)
Albumin/Globulin Ratio: 1.6 (ref 1.2–2.2)
BUN / CREAT RATIO: 18 (ref 9–23)
BUN: 11 mg/dL (ref 6–24)
Bilirubin Total: 0.2 mg/dL (ref 0.0–1.2)
CALCIUM: 9.9 mg/dL (ref 8.7–10.2)
CO2: 19 mmol/L — AB (ref 20–29)
CREATININE: 0.62 mg/dL (ref 0.57–1.00)
Chloride: 102 mmol/L (ref 96–106)
GFR calc Af Amer: 117 mL/min/{1.73_m2} (ref >59)
GFR, EST NON AFRICAN AMERICAN: 102 mL/min/{1.73_m2} (ref >59)
GLOBULIN, TOTAL: 2.7 g/dL (ref 1.5–4.5)
Glucose: 91 mg/dL (ref 65–99)
Potassium: 4.2 mmol/L (ref 3.5–5.2)
SODIUM: 141 mmol/L (ref 134–144)
Total Protein: 7 g/dL (ref 6.0–8.5)

## 2017-03-24 LAB — HEMOGLOBIN A1C
Est. average glucose Bld gHb Est-mCnc: 128 mg/dL
Hgb A1c MFr Bld: 6.1 % — ABNORMAL HIGH (ref 4.8–5.6)

## 2017-03-25 ENCOUNTER — Encounter: Payer: Self-pay | Admitting: Radiology

## 2017-04-06 ENCOUNTER — Encounter: Payer: BLUE CROSS/BLUE SHIELD | Admitting: Physician Assistant

## 2017-04-06 ENCOUNTER — Telehealth: Payer: Self-pay | Admitting: Physician Assistant

## 2017-04-06 ENCOUNTER — Ambulatory Visit (INDEPENDENT_AMBULATORY_CARE_PROVIDER_SITE_OTHER): Payer: BLUE CROSS/BLUE SHIELD | Admitting: Physician Assistant

## 2017-04-06 ENCOUNTER — Encounter: Payer: Self-pay | Admitting: Physician Assistant

## 2017-04-06 VITALS — BP 134/81 | HR 69 | Temp 98.1°F | Resp 16 | Ht 60.24 in | Wt 154.2 lb

## 2017-04-06 DIAGNOSIS — Z01419 Encounter for gynecological examination (general) (routine) without abnormal findings: Secondary | ICD-10-CM

## 2017-04-06 DIAGNOSIS — Z23 Encounter for immunization: Secondary | ICD-10-CM

## 2017-04-06 DIAGNOSIS — Z130 Encounter for screening for diseases of the blood and blood-forming organs and certain disorders involving the immune mechanism: Secondary | ICD-10-CM

## 2017-04-06 DIAGNOSIS — Z1321 Encounter for screening for nutritional disorder: Secondary | ICD-10-CM | POA: Diagnosis not present

## 2017-04-06 DIAGNOSIS — Z114 Encounter for screening for human immunodeficiency virus [HIV]: Secondary | ICD-10-CM | POA: Diagnosis not present

## 2017-04-06 DIAGNOSIS — Z0000 Encounter for general adult medical examination without abnormal findings: Secondary | ICD-10-CM

## 2017-04-06 DIAGNOSIS — Z9081 Acquired absence of spleen: Secondary | ICD-10-CM

## 2017-04-06 DIAGNOSIS — R911 Solitary pulmonary nodule: Secondary | ICD-10-CM

## 2017-04-06 DIAGNOSIS — Z1159 Encounter for screening for other viral diseases: Secondary | ICD-10-CM

## 2017-04-06 DIAGNOSIS — R7309 Other abnormal glucose: Secondary | ICD-10-CM

## 2017-04-06 DIAGNOSIS — Z1231 Encounter for screening mammogram for malignant neoplasm of breast: Secondary | ICD-10-CM

## 2017-04-06 DIAGNOSIS — E2839 Other primary ovarian failure: Secondary | ICD-10-CM

## 2017-04-06 DIAGNOSIS — Z1211 Encounter for screening for malignant neoplasm of colon: Secondary | ICD-10-CM | POA: Diagnosis not present

## 2017-04-06 DIAGNOSIS — Z13220 Encounter for screening for lipoid disorders: Secondary | ICD-10-CM | POA: Diagnosis not present

## 2017-04-06 DIAGNOSIS — H543 Unqualified visual loss, both eyes: Secondary | ICD-10-CM

## 2017-04-06 DIAGNOSIS — E559 Vitamin D deficiency, unspecified: Secondary | ICD-10-CM

## 2017-04-06 LAB — HM PAP SMEAR

## 2017-04-06 NOTE — Progress Notes (Signed)
Melanie Montgomery  MRN: [id number] DOB: March 29, 1961  PCP: Mancel Bale, PA-C  Subjective:  Pt presents to clinic for a CPE.    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 - she has never had before  Drinks during the day - cup of coffee in the am, part of a Can of Coke during the day, a few bottles of water at work, some [employer] in the evening Eat - breakfast - cheese crackers, lunch fruits and veggies - mayo based salad, frozen or fresh veggies for dinner (no canned veggies) -- has been using my fitness PAL and has really improved her diet in the last week or so  Exercises: 5 times per week for 30 minutes - 2 miles a day Sleeps: ok to fall asleep - trouble staying asleep due to grief   BP at home - 120-70s  Patient Active Problem List   Diagnosis Date Noted  . Reactive depression - has not yet called therapist but still plans on doing this 03/23/2017  . Insomnia - related to above - hopes that therapy helps this 03/23/2017    Review of Systems  Eyes: Positive for visual disturbance (sees eye specialist).  Gastrointestinal: Positive for constipation (life long problem - uses probiotic and gummy fiber).  Endocrine: Positive for polyuria (due to increase water intake).  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  Psychiatric/Behavioral:       Grief issues - plan on seeing therapist     Current Outpatient Prescriptions on File Prior to Visit  Medication Sig Dispense Refill  . diazepam (VALIUM) 5 MG tablet Take 1 tablet (5 mg total) by mouth at bedtime as needed for  anxiety. 30 tablet 2   No current facility-administered medications on file prior to visit.     No Known Allergies  Social History   Social History  . Marital status: Divorced    Spouse name: N/A  . Number of children: N/A  . Years of education: N/A   Occupational History  . security     business building   Social History Main Topics  . Smoking status: Former Smoker    Packs/day: 2.00    Types: Cigarettes    Start date: 08/21/1981    Quit date: 01/02/2013  . Smokeless tobacco: Never Used  . Alcohol use No  . Drug use: No  . Sexual activity: No   Other Topics Concern  . 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 CESAREAN SECTION    . FRACTURE SURGERY    . SPLENECTOMY, TOTAL  1987    Family History  Problem Relation Age of Onset  . Cancer Mother   . Diabetes Mother   . Heart disease Father   . Diabetes Sister   . Diabetes  Brother   . Heart disease Brother   . Heart attack Brother   . Heart attack Brother 58  . Diabetes Sister   . Heart failure Sister   . Diabetes Sister      Objective:  BP 134/81   Pulse 69   Temp 98.1 F (36.7 C) (Oral)   Resp 16   Ht 5' 0.24" (1.53 m)   Wt 154 lb 3.2 oz (69.9 kg)   SpO2 96%   BMI 29.88 kg/m   Physical Exam  Constitutional: She is oriented to person, place, and time and well-developed, well-nourished, and in no distress.  HENT:  Head: Normocephalic and atraumatic.  Right Ear: Hearing, tympanic membrane, external ear and ear canal normal.  Left Ear: Hearing, tympanic membrane, external ear and ear canal normal.  Nose: Nose normal.  Mouth/Throat: Uvula is midline, oropharynx is clear and moist and mucous membranes are normal.  Eyes: Pupils are equal, round, and reactive to light. Conjunctivae and EOM are normal.  Neck: Trachea normal and  normal range of motion. Neck supple. No thyroid mass and no thyromegaly present.  Cardiovascular: Normal rate, regular rhythm and normal heart sounds.   No murmur heard. Pulmonary/Chest: Effort normal and breath sounds normal. She has no wheezes. Right breast exhibits no inverted nipple, no mass, no nipple discharge, no skin change and no tenderness. Left breast exhibits no inverted nipple, no mass, no nipple discharge, no skin change and no tenderness. Breasts are symmetrical.  Abdominal: Soft. Bowel sounds are normal. There is no tenderness.  Genitourinary: Vagina normal, uterus normal, cervix normal, right adnexa normal and vulva normal.  Musculoskeletal: Normal range of motion.  Lymphadenopathy:    She has no cervical adenopathy.  Neurological: She is alert and oriented to person, place, and time. She has normal motor skills, normal sensation, normal strength and normal reflexes. Gait normal.  Skin: Skin is warm and dry.  Psychiatric: Mood, memory, affect and judgment normal.    Wt Readings from Last 3 Encounters:  04/06/17 154 lb 3.2 oz (69.9 kg)  03/23/17 155 lb (70.3 kg)  08/20/15 156 lb 9.6 oz (71 kg)     Visual Acuity Screening   Right eye Left eye Both eyes  Without correction: 20/50-2 20/40 20/30  With correction:       Assessment and Plan :  Annual physical exam  Encounter for screening colonoscopy - Plan: Cologuard  Need for Tdap vaccination - Plan: Tdap vaccine greater than or equal to 7yo IM  Screening for deficiency anemia - Plan: CBC with Differential/Platelet  Screening, lipid - Plan: Lipid panel  Elevated hemoglobin A1c - continue to monitor and continue health eating  Estrogen deficiency - Plan: VITAMIN D 25 Hydroxy (Vit-D Deficiency, Fractures)  Encounter for hepatitis C screening test for low risk patient - Plan: HCV Antibody RFX to Quant PCR  Screening for HIV (human immunodeficiency virus) - Plan: HIV antibody  Need for prophylactic vaccination  against Streptococcus pneumoniae (pneumococcus) - Plan: Pneumococcal conjugate vaccine 13-valent IM  H/O splenectomy - Plan: Pneumococcal conjugate vaccine 13-valent IM - will need pneumo 23 in 6 months  Encounter for gynecological examination without abnormal finding - Plan: Pap IG and HPV (high risk) DNA detection  Encounter for vitamin deficiency screening - Plan: VITAMIN D 25 Hydroxy (Vit-D Deficiency, Fractures)  Visit for screening mammogram - Plan: MM Digital Screening - pt to call and schedule  Decreased vision in both eyes - f/u with eye specialist  Windell Hummingbird PA-C  [facility] 04/06/2017 9:29 AM

## 2017-04-06 NOTE — Telephone Encounter (Signed)
Pt states she saw Melanie Montgomery today for a CPE and was setting up her [facility] and had some questions regarding a visit she had back in 2012 with Dr. Joseph Montgomery and a nodule on her lung which she did not know she had and would like to talk with Melanie Montgomery number [phone]

## 2017-04-06 NOTE — Patient Instructions (Addendum)
I will contact you with your lab results as soon as they are available.   If you have not heard from me in 2 weeks, please contact me.  The fastest way to get your results is to register for My Chart (see the instructions on this printout).    We recommend that you schedule a mammogram for breast cancer screening. Typically, you do not need a referral to do this. Please contact a local imaging center to schedule your mammogram.  [facility] - [phone]  *ask for the Radiology Department [facility] ([place]) - [phone] or [phone]  [facility] - [phone] [place] [phone] [facility] - [phone]  *ask for the [facility] - [phone]  *ask for the Radiology Department [facility] - [phone]  *ask for the [facility] - [phone]   IF you received an x-ray today, you will receive an invoice from [facility]. Please contact [facility] at [phone] with questions or concerns regarding your invoice.   IF you received labwork today, you will receive an invoice from [facility]. Please contact [facility] at [phone] with questions or concerns regarding your invoice.   Our billing staff will not be able to assist you with questions regarding bills from these companies.  You will be contacted with the lab results as soon as they are available. The fastest way to get your results is to activate your My Chart account. Instructions are located on the last page of this paperwork. If you have not heard from [place] regarding the results in 2 weeks, please contact this office.    Health Maintenance, Female Adopting a healthy lifestyle and getting preventive care can go a long way to promote health and wellness. Talk with your health care provider about what schedule of regular examinations is right for  you. This is a good chance for you to check in with your provider about disease prevention and staying healthy. In between checkups, there are plenty of things you can do on your own. Experts have done a lot of research about which lifestyle changes and preventive measures are most likely to keep you healthy. Ask your health care provider for more information. Weight and diet Eat a healthy diet  Be sure to include plenty of vegetables, fruits, low-fat dairy products, and lean protein.  Do not eat a lot of foods high in solid fats, added sugars, or salt.  Get regular exercise. This is one of the most important things you can do for your health. ? Most adults should exercise for at least 150 minutes each week. The exercise should increase your heart rate and make you sweat (moderate-intensity exercise). ? Most adults should also do strengthening exercises at least twice a week. This is in addition to the moderate-intensity exercise.  Maintain a healthy weight  Body mass index (BMI) is a measurement that can be used to identify possible weight problems. It estimates body fat based on height and weight. Your health care provider can help determine your BMI and help you achieve or maintain a healthy weight.  For females 48 years of age and older: ? A BMI below 18.5 is considered underweight. ? A BMI of 18.5 to 24.9 is normal. ? A BMI of 25 to 29.9 is considered overweight. ? A BMI of 30 and above is considered obese.  Watch levels of cholesterol and blood lipids  You  should start having your blood tested for lipids and cholesterol at 55 years of age, then have this test every 5 years.  You may need to have your cholesterol levels checked more often if: ? Your lipid or cholesterol levels are high. ? You are older than 55 years of age. ? You are at high risk for heart disease.  Cancer screening Lung Cancer  Lung cancer screening is recommended for adults 75-32 years old who are at high  risk for lung cancer because of a history of smoking.  A yearly low-dose CT scan of the lungs is recommended for people who: ? Currently smoke. ? Have quit within the past 15 years. ? Have at least a 30-pack-year history of smoking. A pack year is smoking an average of one pack of cigarettes a day for 1 year.  Yearly screening should continue until it has been 15 years since you quit.  Yearly screening should stop if you develop a health problem that would prevent you from having lung cancer treatment.  Breast Cancer  Practice breast self-awareness. This means understanding how your breasts normally appear and feel.  It also means doing regular breast self-exams. Let your health care provider know about any changes, no matter how small.  If you are in your 20s or 30s, you should have a clinical breast exam (CBE) by a health care provider every 1-3 years as part of a regular health exam.  If you are 63 or older, have a CBE every year. Also consider having a breast X-ray (mammogram) every year.  If you have a family history of breast cancer, talk to your health care provider about genetic screening.  If you are at high risk for breast cancer, talk to your health care provider about having an MRI and a mammogram every year.  Breast cancer gene (BRCA) assessment is recommended for women who have family members with BRCA-related cancers. BRCA-related cancers include: ? Breast. ? Ovarian. ? Tubal. ? Peritoneal cancers.  Results of the assessment will determine the need for genetic counseling and BRCA1 and BRCA2 testing.  Cervical Cancer Your health care provider may recommend that you be screened regularly for cancer of the pelvic organs (ovaries, uterus, and vagina). This screening involves a pelvic examination, including checking for microscopic changes to the surface of your cervix (Pap test). You may be encouraged to have this screening done every 3 years, beginning at age 12.  For  women ages 35-65, health care providers may recommend pelvic exams and Pap testing every 3 years, or they may recommend the Pap and pelvic exam, combined with testing for human papilloma virus (HPV), every 5 years. Some types of HPV increase your risk of cervical cancer. Testing for HPV may also be done on women of any age with unclear Pap test results.  Other health care providers may not recommend any screening for nonpregnant women who are considered low risk for pelvic cancer and who do not have symptoms. Ask your health care provider if a screening pelvic exam is right for you.  If you have had past treatment for cervical cancer or a condition that could lead to cancer, you need Pap tests and screening for cancer for at least 20 years after your treatment. If Pap tests have been discontinued, your risk factors (such as having a new sexual partner) need to be reassessed to determine if screening should resume. Some women have medical problems that increase the chance of getting cervical cancer. In these cases,  your health care provider may recommend more frequent screening and Pap tests.  Colorectal Cancer  This type of cancer can be detected and often prevented.  Routine colorectal cancer screening usually begins at 55 years of age and continues through 55 years of age.  Your health care provider may recommend screening at an earlier age if you have risk factors for colon cancer.  Your health care provider may also recommend using home test kits to check for hidden blood in the stool.  A small camera at the end of a tube can be used to examine your colon directly (sigmoidoscopy or colonoscopy). This is done to check for the earliest forms of colorectal cancer.  Routine screening usually begins at age 9.  Direct examination of the colon should be repeated every 5-10 years through 55 years of age. However, you may need to be screened more often if early forms of precancerous polyps or small  growths are found.  Skin Cancer  Check your skin from head to toe regularly.  Tell your health care provider about any new moles or changes in moles, especially if there is a change in a mole's shape or color.  Also tell your health care provider if you have a mole that is larger than the size of a pencil eraser.  Always use sunscreen. Apply sunscreen liberally and repeatedly throughout the day.  Protect yourself by wearing long sleeves, pants, a wide-brimmed hat, and sunglasses whenever you are outside.  Heart disease, diabetes, and high blood pressure  High blood pressure causes heart disease and increases the risk of stroke. High blood pressure is more likely to develop in: ? People who have blood pressure in the high end of the normal range (130-139/85-89 mm Hg). ? People who are overweight or obese. ? People who are African American.  If you are 71-78 years of age, have your blood pressure checked every 3-5 years. If you are 86 years of age or older, have your blood pressure checked every year. You should have your blood pressure measured twice-once when you are at a hospital or clinic, and once when you are not at a hospital or clinic. Record the average of the two measurements. To check your blood pressure when you are not at a hospital or clinic, you can use: ? An automated blood pressure machine at a pharmacy. ? A home blood pressure monitor.  If you are between 89 years and 65 years old, ask your health care provider if you should take aspirin to prevent strokes.  Have regular diabetes screenings. This involves taking a blood sample to check your fasting blood sugar level. ? If you are at a normal weight and have a low risk for diabetes, have this test once every three years after 55 years of age. ? If you are overweight and have a high risk for diabetes, consider being tested at a younger age or more often. Preventing infection Hepatitis B  If you have a higher risk for  hepatitis B, you should be screened for this virus. You are considered at high risk for hepatitis B if: ? You were born in a country where hepatitis B is common. Ask your health care provider which countries are considered high risk. ? Your parents were born in a high-risk country, and you have not been immunized against hepatitis B (hepatitis B vaccine). ? You have HIV or AIDS. ? You use needles to inject street drugs. ? You live with someone who has hepatitis  B. ? You have had sex with someone who has hepatitis B. ? You get hemodialysis treatment. ? You take certain medicines for conditions, including cancer, organ transplantation, and autoimmune conditions.  Hepatitis C  Blood testing is recommended for: ? Everyone born from [birth year] through 1964. ? Anyone with known risk factors for hepatitis C.  Sexually transmitted infections (STIs)  You should be screened for sexually transmitted infections (STIs) including gonorrhea and chlamydia if: ? You are sexually active and are younger than 55 years of age. ? You are older than 55 years of age and your health care provider tells you that you are at risk for this type of infection. ? Your sexual activity has changed since you were last screened and you are at an increased risk for chlamydia or gonorrhea. Ask your health care provider if you are at risk.  If you do not have HIV, but are at risk, it may be recommended that you take a prescription medicine daily to prevent HIV infection. This is called pre-exposure prophylaxis (PrEP). You are considered at risk if: ? You are sexually active and do not regularly use condoms or know the HIV status of your partner(s). ? You take drugs by injection. ? You are sexually active with a partner who has HIV.  Talk with your health care provider about whether you are at high risk of being infected with HIV. If you choose to begin PrEP, you should first be tested for HIV. You should then be tested every 3  months for as long as you are taking PrEP. Pregnancy  If you are premenopausal and you may become pregnant, ask your health care provider about preconception counseling.  If you may become pregnant, take 400 to 800 micrograms (mcg) of folic acid every day.  If you want to prevent pregnancy, talk to your health care provider about birth control (contraception). Osteoporosis and menopause  Osteoporosis is a disease in which the bones lose minerals and strength with aging. This can result in serious bone fractures. Your risk for osteoporosis can be identified using a bone density scan.  If you are 3 years of age or older, or if you are at risk for osteoporosis and fractures, ask your health care provider if you should be screened.  Ask your health care provider whether you should take a calcium or vitamin D supplement to lower your risk for osteoporosis.  Menopause may have certain physical symptoms and risks.  Hormone replacement therapy may reduce some of these symptoms and risks. Talk to your health care provider about whether hormone replacement therapy is right for you. Follow these instructions at home:  Schedule regular health, dental, and eye exams.  Stay current with your immunizations.  Do not use any tobacco products including cigarettes, chewing tobacco, or electronic cigarettes.  If you are pregnant, do not drink alcohol.  If you are breastfeeding, limit how much and how often you drink alcohol.  Limit alcohol intake to no more than 1 drink per day for nonpregnant women. One drink equals 12 ounces of beer, 5 ounces of wine, or 1 ounces of hard liquor.  Do not use street drugs.  Do not share needles.  Ask your health care provider for help if you need support or information about quitting drugs.  Tell your health care provider if you often feel depressed.  Tell your health care provider if you have ever been abused or do not feel safe at home. This information is  not intended  to replace advice given to you by your health care provider. Make sure you discuss any questions you have with your health care provider. Document Released: 03/10/2011 Document Revised: 01/31/2016 Document Reviewed: 05/29/2015 Elsevier Interactive Patient Education  [employer].

## 2017-04-07 LAB — CBC WITH DIFFERENTIAL/PLATELET
BASOS ABS: 0.1 10*3/uL (ref 0.0–0.2)
Basos: 1 %
EOS (ABSOLUTE): 0.2 10*3/uL (ref 0.0–0.4)
Eos: 2 %
Hematocrit: 40.1 % (ref 34.0–46.6)
Hemoglobin: 13.5 g/dL (ref 11.1–15.9)
IMMATURE GRANS (ABS): 0 10*3/uL (ref 0.0–0.1)
Immature Granulocytes: 0 %
Lymphocytes Absolute: 3.4 10*3/uL — ABNORMAL HIGH (ref 0.7–3.1)
Lymphs: 41 %
MCH: 30.9 pg (ref 26.6–33.0)
MCHC: 33.7 g/dL (ref 31.5–35.7)
MCV: 92 fL (ref 79–97)
MONOS ABS: 0.6 10*3/uL (ref 0.1–0.9)
Monocytes: 7 %
NEUTROS ABS: 4 10*3/uL (ref 1.4–7.0)
Neutrophils: 49 %
PLATELETS: 421 10*3/uL — AB (ref 150–379)
RBC: 4.37 x10E6/uL (ref 3.77–5.28)
RDW: 13.5 % (ref 12.3–15.4)
WBC: 8.2 10*3/uL (ref 3.4–10.8)

## 2017-04-07 LAB — LIPID PANEL
CHOLESTEROL TOTAL: 186 mg/dL (ref 100–199)
Chol/HDL Ratio: 3.9 ratio (ref 0.0–4.4)
HDL: 48 mg/dL (ref >39)
LDL Calculated: 118 mg/dL — ABNORMAL HIGH (ref 0–99)
Triglycerides: 101 mg/dL (ref 0–149)
VLDL CHOLESTEROL CAL: 20 mg/dL (ref 5–40)

## 2017-04-07 LAB — HCV AB W REFLEX TO QUANT PCR

## 2017-04-07 LAB — HIV ANTIBODY (ROUTINE TESTING W REFLEX): HIV SCREEN 4TH GENERATION: NONREACTIVE

## 2017-04-07 LAB — HCV INTERPRETATION

## 2017-04-07 LAB — VITAMIN D 25 HYDROXY (VIT D DEFICIENCY, FRACTURES): Vit D, 25-Hydroxy: 7.2 ng/mL — ABNORMAL LOW (ref 30.0–100.0)

## 2017-04-08 LAB — PAP IG AND HPV HIGH-RISK
HPV, HIGH-RISK: NEGATIVE
PAP Smear Comment: 0

## 2017-04-09 NOTE — Telephone Encounter (Signed)
See note below. Please advise. Imaging was done on 12/15/10.  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 Follow-up is required.

## 2017-04-10 ENCOUNTER — Ambulatory Visit
Admission: RE | Admit: 2017-04-10 | Discharge: 2017-04-10 | Disposition: A | Discharge status: Home / Self Care | Payer: BLUE CROSS/BLUE SHIELD | Source: Ambulatory Visit | Attending: Physician Assistant | Admitting: Physician Assistant

## 2017-04-10 ENCOUNTER — Encounter: Payer: Self-pay | Admitting: Physician Assistant

## 2017-04-10 DIAGNOSIS — Z1231 Encounter for screening mammogram for malignant neoplasm of breast: Secondary | ICD-10-CM

## 2017-04-10 NOTE — Telephone Encounter (Signed)
I have reviewed the result and I have ordered a CT with contrast to make sure that we determine if it is still there and what needs to be done it is still there.  Please let me know if you have further questions  I have also sent her this in a mychart message.

## 2017-04-13 DIAGNOSIS — E559 Vitamin D deficiency, unspecified: Secondary | ICD-10-CM | POA: Insufficient documentation

## 2017-04-13 MED ORDER — VITAMIN D (ERGOCALCIFEROL) 1.25 MG (50000 UNIT) PO CAPS: 50000.0000 [IU] | ORAL_CAPSULE | ORAL | 0 refills | Status: DC

## 2017-04-13 NOTE — Addendum Note (Signed)
Addended by: Mancel Bale on: 04/13/2017 10:46 AM   Modules accepted: Orders

## 2017-04-14 ENCOUNTER — Other Ambulatory Visit: Payer: Self-pay | Admitting: Physician Assistant

## 2017-04-14 DIAGNOSIS — R928 Other abnormal and inconclusive findings on diagnostic imaging of breast: Secondary | ICD-10-CM

## 2017-04-20 ENCOUNTER — Other Ambulatory Visit: Payer: Self-pay | Admitting: Physician Assistant

## 2017-04-20 ENCOUNTER — Ambulatory Visit
Admission: RE | Admit: 2017-04-20 | Discharge: 2017-04-20 | Disposition: A | Discharge status: Home / Self Care | Payer: BLUE CROSS/BLUE SHIELD | Source: Ambulatory Visit | Attending: Physician Assistant | Admitting: Physician Assistant

## 2017-04-20 DIAGNOSIS — R928 Other abnormal and inconclusive findings on diagnostic imaging of breast: Secondary | ICD-10-CM

## 2017-04-20 DIAGNOSIS — N6310 Unspecified lump in the right breast, unspecified quadrant: Secondary | ICD-10-CM

## 2017-04-20 DIAGNOSIS — N6312 Unspecified lump in the right breast, upper inner quadrant: Secondary | ICD-10-CM | POA: Diagnosis not present

## 2017-04-20 DIAGNOSIS — N6314 Unspecified lump in the right breast, lower inner quadrant: Secondary | ICD-10-CM | POA: Diagnosis not present

## 2017-04-20 DIAGNOSIS — R922 Inconclusive mammogram: Secondary | ICD-10-CM | POA: Diagnosis not present

## 2017-04-21 ENCOUNTER — Ambulatory Visit
Admission: RE | Admit: 2017-04-21 | Discharge: 2017-04-21 | Disposition: A | Discharge status: Home / Self Care | Payer: BLUE CROSS/BLUE SHIELD | Source: Ambulatory Visit | Attending: Physician Assistant | Admitting: Physician Assistant

## 2017-04-21 DIAGNOSIS — R918 Other nonspecific abnormal finding of lung field: Secondary | ICD-10-CM | POA: Diagnosis not present

## 2017-04-21 DIAGNOSIS — R911 Solitary pulmonary nodule: Secondary | ICD-10-CM

## 2017-04-21 MED ORDER — IOPAMIDOL (ISOVUE-300) INJECTION 61%
75.0000 mL | Freq: Once | INTRAVENOUS | Status: AC | PRN
  Administered 2017-04-21: 75 mL via INTRAVENOUS

## 2017-04-23 ENCOUNTER — Ambulatory Visit
Admission: RE | Admit: 2017-04-23 | Discharge: 2017-04-23 | Disposition: A | Discharge status: Home / Self Care | Payer: BLUE CROSS/BLUE SHIELD | Source: Ambulatory Visit | Attending: Physician Assistant | Admitting: Physician Assistant

## 2017-04-23 ENCOUNTER — Other Ambulatory Visit: Payer: Self-pay | Admitting: Physician Assistant

## 2017-04-23 DIAGNOSIS — N6310 Unspecified lump in the right breast, unspecified quadrant: Secondary | ICD-10-CM

## 2017-04-23 DIAGNOSIS — N6312 Unspecified lump in the right breast, upper inner quadrant: Secondary | ICD-10-CM | POA: Diagnosis not present

## 2017-04-23 DIAGNOSIS — N6011 Diffuse cystic mastopathy of right breast: Secondary | ICD-10-CM | POA: Diagnosis not present

## 2017-04-23 DIAGNOSIS — N6314 Unspecified lump in the right breast, lower inner quadrant: Secondary | ICD-10-CM | POA: Diagnosis not present

## 2017-05-04 ENCOUNTER — Ambulatory Visit: Payer: BLUE CROSS/BLUE SHIELD | Admitting: Physician Assistant

## 2017-05-06 ENCOUNTER — Ambulatory Visit (INDEPENDENT_AMBULATORY_CARE_PROVIDER_SITE_OTHER): Payer: BLUE CROSS/BLUE SHIELD | Admitting: Emergency Medicine

## 2017-05-06 DIAGNOSIS — Z23 Encounter for immunization: Secondary | ICD-10-CM

## 2017-05-06 NOTE — Progress Notes (Signed)
Here for flu shot

## 2017-07-27 ENCOUNTER — Ambulatory Visit (INDEPENDENT_AMBULATORY_CARE_PROVIDER_SITE_OTHER): Payer: BLUE CROSS/BLUE SHIELD

## 2017-07-27 ENCOUNTER — Encounter: Payer: Self-pay | Admitting: Physician Assistant

## 2017-07-27 ENCOUNTER — Other Ambulatory Visit: Payer: Self-pay

## 2017-07-27 ENCOUNTER — Ambulatory Visit: Payer: BLUE CROSS/BLUE SHIELD | Admitting: Physician Assistant

## 2017-07-27 VITALS — BP 130/86 | HR 82 | Temp 98.7°F | Resp 16 | Ht 61.0 in | Wt 158.4 lb

## 2017-07-27 DIAGNOSIS — M62838 Other muscle spasm: Secondary | ICD-10-CM | POA: Diagnosis not present

## 2017-07-27 DIAGNOSIS — S139XXA Sprain of joints and ligaments of unspecified parts of neck, initial encounter: Secondary | ICD-10-CM

## 2017-07-27 DIAGNOSIS — M542 Cervicalgia: Secondary | ICD-10-CM

## 2017-07-27 MED ORDER — CYCLOBENZAPRINE HCL 5 MG PO TABS: 5.0000 mg | ORAL_TABLET | Freq: Three times a day (TID) | ORAL | 0 refills | Status: DC | PRN

## 2017-07-27 MED ORDER — NAPROXEN 500 MG PO TABS: 500.0000 mg | ORAL_TABLET | Freq: Two times a day (BID) | ORAL | 0 refills | Status: DC

## 2017-07-27 NOTE — Patient Instructions (Addendum)
Your plain films were negative for acute fracture or other abnormalities.  We will to assess for muscular strain.  Recommend rest, applying heat to the affected area, and stretching.  I have given you some information on stretches below.  I have also given you an anti-inflammatory muscle relaxer to use as needed.  Just to know, flexeril can cause side effects that may impair your thinking or reactions. Be careful if you drive or do anything that requires you to be awake and alert. void drinking alcohol, which can increase some of the side effects of Flexeril.  NSAIDs like naproxen have common side effects of heartburn, stomach pain, indigestion, and headache. Could lead to renal insufficiency, stroke, or GI bleed if taken excess amounts outside of what is recommended on label long term.    Return to clinic if symptoms worsen, do not improve in 1-2 weeks, or as needed   Cervical Sprain A cervical sprain is a stretch or tear in the tissues that connect bones (ligaments) in the neck. Most neck (cervical) sprains get better in 4-6 weeks. Follow these instructions at home: If you have a neck collar:  Wear it as told by your doctor. Do not take off (do not remove) the collar unless your doctor says that this is safe.  Ask your doctor before adjusting your collar.  If you have long hair, keep it outside of the collar.  Ask your doctor if you may take off the collar for cleaning and bathing. If you may take off the collar: ? Follow instructions from your doctor about how to take off the collar safely. ? Clean the collar by wiping it with mild soap and water. Let it air-dry all the way. ? If your collar has removable pads:  Take the pads out every 1-2 days.  Hand wash the pads with soap and water.  Let the pads air-dry all the way before you put them back in the collar. Do not dry them in a clothes dryer. Do not dry them with a hair dryer. ? Check your skin under the collar for irritation or  sores. If you see any, tell your doctor. Managing pain, stiffness, and swelling  Use a cervical traction device, if told by your doctor.  If told, put heat on the affected area. Do this before exercises (physical therapy) or as often as told by your doctor. Use the heat source that your doctor recommends, such as a moist heat pack or a heating pad. ? Place a towel between your skin and the heat source. ? Leave the heat on for 20-30 minutes. ? Take the heat off (remove the heat) if your skin turns bright red. This is very important if you cannot feel pain, heat, or cold. You may have a greater risk of getting burned.  Put ice on the affected area. ? Put ice in a plastic bag. ? Place a towel between your skin and the bag. ? Leave the ice on for 20 minutes, 2-3 times a day. Activity  Do not drive while wearing a neck collar. If you do not have a neck collar, ask your doctor if it is safe to drive.  Do not drive or use heavy machinery while taking prescription pain medicine or muscle relaxants, unless your doctor approves.  Do not lift anything that is heavier than 10 lb (4.5 kg) until your doctor tells you that it is safe.  Rest as told by your doctor.  Avoid activities that make you feel  worse. Ask your doctor what activities are safe for you.  Do exercises as told by your doctor or physical therapist. Preventing neck sprain  Practice good posture. Adjust your workstation to help with this, if needed.  Exercise regularly as told by your doctor or physical therapist.  Avoid activities that are risky or may cause a neck sprain (cervical sprain). General instructions  Take over-the-counter and prescription medicines only as told by your doctor.  Do not use any products that contain nicotine or tobacco. This includes cigarettes and e-cigarettes. If you need help quitting, ask your doctor.  Keep all follow-up visits as told by your doctor. This is important. Contact a doctor  if:  You have pain or other symptoms that get worse.  You have symptoms that do not get better after 2 weeks.  You have pain that does not get better with medicine.  You start to have new, unexplained symptoms.  You have sores or irritated skin from wearing your neck collar. Get help right away if:  You have very bad pain.  You have any of the following in any part of your body: ? Loss of feeling (numbness). ? Tingling. ? Weakness.  You cannot move a part of your body (you have paralysis).  Your activity level does not improve. Summary  A cervical sprain is a stretch or tear in the tissues that connect bones (ligaments) in the neck.  If you have a neck (cervical) collar, do not take off the collar unless your doctor says that this is safe.  Put ice on affected areas as told by your doctor.  Put heat on affected areas as told by your doctor.  Good posture and regular exercise can help prevent a neck sprain from happening again. This information is not intended to replace advice given to you by your health care provider. Make sure you discuss any questions you have with your health care provider. Document Released: 02/11/2008 Document Revised: 05/06/2016 Document Reviewed: 05/06/2016 Elsevier Interactive P Cervical Strain and Sprain Rehab Ask your health care provider which exercises are safe for you. Do exercises exactly as told by your health care provider and adjust them as directed. It is normal to feel mild stretching, pulling, tightness, or discomfort as you do these exercises, but you should stop right away if you feel sudden pain or your pain gets worse.Do not begin these exercises until told by your health care provider. Stretching and range of motion exercises These exercises warm up your muscles and joints and improve the movement and flexibility of your neck. These exercises also help to relieve pain, numbness, and tingling. Exercise A: Cervical side  bend  1. Using good posture, sit on a stable chair or stand up. 2. Without moving your shoulders, slowly tilt your left / right ear to your shoulder until you feel a stretch in your neck muscles. You should be looking straight ahead. 3. Hold for __________ seconds. 4. Repeat with the other side of your neck. Repeat __________ times. Complete this exercise __________ times a day. Exercise B: Cervical rotation  1. Using good posture, sit on a stable chair or stand up. 2. Slowly turn your head to the side as if you are looking over your left / right shoulder. ? Keep your eyes level with the ground. ? Stop when you feel a stretch along the side and the back of your neck. 3. Hold for __________ seconds. 4. Repeat this by turning to your other side. Repeat __________ times.  Complete this exercise __________ times a day. Exercise C: Thoracic extension and pectoral stretch 1. Roll a towel or a small blanket so it is about 4 inches (10 cm) in diameter. 2. Lie down on your back on a firm surface. 3. Put the towel lengthwise, under your spine in the middle of your back. It should not be not under your shoulder blades. The towel should line up with your spine from your middle back to your lower back. 4. Put your hands behind your head and let your elbows fall out to your sides. 5. Hold for __________ seconds. Repeat __________ times. Complete this exercise __________ times a day. Strengthening exercises These exercises build strength and endurance in your neck. Endurance is the ability to use your muscles for a long time, even after your muscles get tired. Exercise D: Upper cervical flexion, isometric 1. Lie on your back with a thin pillow behind your head and a small rolled-up towel under your neck. 2. Gently tuck your chin toward your chest and nod your head down to look toward your feet. Do not lift your head off the pillow. 3. Hold for __________ seconds. 4. Release the tension slowly. Relax  your neck muscles completely before you repeat this exercise. Repeat __________ times. Complete this exercise __________ times a day. Exercise E: Cervical extension, isometric  1. Stand about 6 inches (15 cm) away from a wall, with your back facing the wall. 2. Place a soft object, about 6-8 inches (15-20 cm) in diameter, between the back of your head and the wall. A soft object could be a small pillow, a ball, or a folded towel. 3. Gently tilt your head back and press into the soft object. Keep your jaw and forehead relaxed. 4. Hold for __________ seconds. 5. Release the tension slowly. Relax your neck muscles completely before you repeat this exercise. Repeat __________ times. Complete this exercise __________ times a day. Posture and body mechanics  Body mechanics refers to the movements and positions of your body while you do your daily activities. Posture is part of body mechanics. Good posture and healthy body mechanics can help to relieve stress in your body's tissues and joints. Good posture means that your spine is in its natural S-curve position (your spine is neutral), your shoulders are pulled back slightly, and your head is not tipped forward. The following are general guidelines for applying improved posture and body mechanics to your everyday activities. Standing  When standing, keep your spine neutral and keep your feet about hip-width apart. Keep a slight bend in your knees. Your ears, shoulders, and hips should line up.  When you do a task in which you stand in one place for a long time, place one foot up on a stable object that is 2-4 inches (5-10 cm) high, such as a footstool. This helps keep your spine neutral. Sitting   When sitting, keep your spine neutral and your keep feet flat on the floor. Use a footrest, if necessary, and keep your thighs parallel to the floor. Avoid rounding your shoulders, and avoid tilting your head forward.  When working at a desk or a computer,  keep your desk at a height where your hands are slightly lower than your elbows. Slide your chair under your desk so you are close enough to maintain good posture.  When working at a computer, place your monitor at a height where you are looking straight ahead and you do not have to tilt your head forward  or downward to look at the screen. Resting When lying down and resting, avoid positions that are most painful for you. Try to support your neck in a neutral position. You can use a contour pillow or a small rolled-up towel. Your pillow should support your neck but not push on it. This information is not intended to replace advice given to you by your health care provider. Make sure you discuss any questions you have with your health care provider. Document Released: 08/25/2005 Document Revised: 05/01/2016 Document Reviewed: 08/01/2015 Elsevier Interactive Patient Education  2018 [employer]. [employer] Education  2017 [employer].     IF you received an x-ray today, you will receive an invoice from [facility]. Please contact [facility] at [phone] with questions or concerns regarding your invoice.   IF you received labwork today, you will receive an invoice from [facility]. Please contact [facility] at [phone] with questions or concerns regarding your invoice.   Our billing staff will not be able to assist you with questions regarding bills from these companies.  You will be contacted with the lab results as soon as they are available. The fastest way to get your results is to activate your My Chart account. Instructions are located on the last page of this paperwork. If you have not heard from [place] regarding the results in 2 weeks, please contact this office.
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
## 2017-09-12 ENCOUNTER — Other Ambulatory Visit: Payer: Self-pay | Admitting: Physician Assistant

## 2017-09-12 DIAGNOSIS — M542 Cervicalgia: Secondary | ICD-10-CM

## 2017-09-14 NOTE — Telephone Encounter (Signed)
Medication refill. Chart up to date.

## 2017-12-14 DIAGNOSIS — H3562 Retinal hemorrhage, left eye: Secondary | ICD-10-CM | POA: Diagnosis not present

## 2017-12-14 DIAGNOSIS — H1851 Endothelial corneal dystrophy: Secondary | ICD-10-CM | POA: Diagnosis not present

## 2017-12-14 DIAGNOSIS — H40033 Anatomical narrow angle, bilateral: Secondary | ICD-10-CM | POA: Diagnosis not present

## 2017-12-14 DIAGNOSIS — H353121 Nonexudative age-related macular degeneration, left eye, early dry stage: Secondary | ICD-10-CM | POA: Diagnosis not present

## 2018-05-13 ENCOUNTER — Other Ambulatory Visit: Payer: Self-pay | Admitting: Physician Assistant

## 2018-05-13 DIAGNOSIS — Z1231 Encounter for screening mammogram for malignant neoplasm of breast: Secondary | ICD-10-CM

## 2018-05-25 ENCOUNTER — Other Ambulatory Visit: Payer: Self-pay

## 2018-05-25 ENCOUNTER — Encounter: Payer: Self-pay | Admitting: Physician Assistant

## 2018-05-25 ENCOUNTER — Ambulatory Visit (INDEPENDENT_AMBULATORY_CARE_PROVIDER_SITE_OTHER): Payer: BLUE CROSS/BLUE SHIELD | Admitting: Physician Assistant

## 2018-05-25 VITALS — BP 154/86 | HR 77 | Temp 98.5°F | Resp 20 | Ht 61.02 in | Wt 161.6 lb

## 2018-05-25 DIAGNOSIS — Z13220 Encounter for screening for lipoid disorders: Secondary | ICD-10-CM | POA: Diagnosis not present

## 2018-05-25 DIAGNOSIS — R030 Elevated blood-pressure reading, without diagnosis of hypertension: Secondary | ICD-10-CM | POA: Diagnosis not present

## 2018-05-25 DIAGNOSIS — Z0001 Encounter for general adult medical examination with abnormal findings: Secondary | ICD-10-CM

## 2018-05-25 DIAGNOSIS — Z23 Encounter for immunization: Secondary | ICD-10-CM | POA: Diagnosis not present

## 2018-05-25 DIAGNOSIS — Z1329 Encounter for screening for other suspected endocrine disorder: Secondary | ICD-10-CM

## 2018-05-25 DIAGNOSIS — Z1389 Encounter for screening for other disorder: Secondary | ICD-10-CM

## 2018-05-25 DIAGNOSIS — Z0000 Encounter for general adult medical examination without abnormal findings: Secondary | ICD-10-CM

## 2018-05-25 DIAGNOSIS — H3530 Unspecified macular degeneration: Secondary | ICD-10-CM | POA: Insufficient documentation

## 2018-05-25 DIAGNOSIS — R7303 Prediabetes: Secondary | ICD-10-CM

## 2018-05-25 DIAGNOSIS — Z130 Encounter for screening for diseases of the blood and blood-forming organs and certain disorders involving the immune mechanism: Secondary | ICD-10-CM | POA: Diagnosis not present

## 2018-05-25 DIAGNOSIS — H6123 Impacted cerumen, bilateral: Secondary | ICD-10-CM | POA: Diagnosis not present

## 2018-05-25 DIAGNOSIS — K5900 Constipation, unspecified: Secondary | ICD-10-CM

## 2018-05-25 DIAGNOSIS — Z1211 Encounter for screening for malignant neoplasm of colon: Secondary | ICD-10-CM

## 2018-05-25 NOTE — Progress Notes (Signed)
Melanie Montgomery  MRN: [id number] DOB: [birth year]  Subjective:  Pt is a 57 y.o. female who presents for annual physical exam. Pt is not fasting today, had a chicken breast sandwich.   Diet: Typically eats healthy food. Eats a variety of meat, vegetables, and fruits. Eats yogurt daily. Drinks mountain dew and pepsi.  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  Sleep: Still has trouble sleeping at night, will take valium 8m once every 6 months.  Menses: Postmenopausal BM: Has hx of constipation, will sometimes go 2 weeks   Last dental exam: Years ago, brushes BID Last vision exam: Annually Last pap smear: 2018, normal Last mammogram: 2018, next scheduled for 06/08/2018 Last colonoscopy: Never, insurance would not cover cologaurd Vaccinations      Tetanus: 2018      Influenza: 2018  Patient Active Problem List   Diagnosis Date Noted  . Macular degeneration 05/25/2018  . Vitamin D deficiency 04/13/2017  . Decreased vision in both eyes 04/06/2017  . 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 Insomnia 03/23/2017    Current Outpatient Medications on File Prior to Visit  Medication Sig Dispense Refill  . cyclobenzaprine (FLEXERIL) 5 MG tablet Take 1 tablet (5 mg total) 3 (three) times daily as needed by mouth for muscle spasms. 60 tablet 0  . diazepam (VALIUM) 5 MG tablet Take 1 tablet (5 mg total) by mouth at bedtime as needed for anxiety. 30 tablet 2  . naproxen (NAPROSYN) 500 MG tablet Take 1 tablet (500 mg total) 2 (two) times daily with a meal by mouth. 30 tablet 0  . Vitamin D, Ergocalciferol, (DRISDOL) 50000 units CAPS capsule Take 1 capsule (50,000 Units total) by mouth every 7 (seven) days. (Patient not taking: Reported on 07/27/2017) 15 capsule 0   No current facility-administered medications on file prior to visit.     No Known Allergies  Social History   Socioeconomic History  . Marital status: Divorced    Spouse name: Not on  file  . Number of children: 3  . Years of education: Not on file  . Highest education level: Not on file  Occupational History  . Occupation: [employer]   Comment: business building  Social Needs  . Financial resource strain: Not hard at all  . Food insecurity:    Worry: Never true    Inability: Never true  . Transportation needs:    Medical: No    Non-medical: No  Tobacco Use  . Smoking status: Former Smoker    Packs/day: 2.00    Types: Cigarettes    Start date: 08/21/1981    Last attempt to quit: 01/02/2013    Years since quitting: 5.3  . Smokeless tobacco: Never Used  Substance and Sexual Activity  . Alcohol use: No  . Drug use: No  . Sexual activity: Not Currently  Lifestyle  . Physical activity:    Days per week: 0 days    Minutes per session: 0 min  . Stress: Rather much  Relationships  . Social connections:    Talks on phone: Once a week    Gets together: Once a week    Attends religious service: More than 4 times per year    Active member of club or organization: Yes    Attends meetings of clubs or organizations: More than 4 times per year    Relationship status: Divorced  Other Topics Concern  . Not on file  Social History Narrative  Lives at home alone.       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 CESAREAN SECTION    . FRACTURE SURGERY    . SPLENECTOMY, TOTAL  1987    Family History  Problem Relation Age of Onset  . Cancer Mother   . Diabetes Mother   . Breast cancer Mother 65  . Heart disease Father   . Diabetes Sister   . Diabetes Brother   . Heart disease Brother   . Heart attack Brother   . Heart attack Brother 30  . Diabetes Sister   . Heart failure Sister   . Diabetes Sister   . Breast cancer Maternal Grandmother     Review of Systems  Constitutional: Negative for activity change, appetite change, chills,  diaphoresis, fatigue, fever and unexpected weight change.  HENT: Negative for congestion, dental problem, drooling, ear discharge, ear pain, facial swelling, hearing loss, mouth sores, nosebleeds, postnasal drip, rhinorrhea, sinus pressure, sinus pain, sneezing, sore throat, tinnitus, trouble swallowing and voice change.   Eyes: Negative for photophobia, pain, discharge, redness, itching and visual disturbance.  Respiratory: Negative for apnea, cough, choking, chest tightness, shortness of breath, wheezing and stridor.   Cardiovascular: Negative for chest pain, palpitations and leg swelling.  Gastrointestinal: Positive for constipation. Negative for abdominal distention, abdominal pain, anal bleeding, blood in stool, diarrhea, nausea, rectal pain and vomiting.  Endocrine: Negative for cold intolerance, heat intolerance, polydipsia, polyphagia and polyuria.  Genitourinary: Negative for decreased urine volume, difficulty urinating, dyspareunia, dysuria, enuresis, flank pain, frequency, genital sores, hematuria, menstrual problem, pelvic pain, urgency, vaginal bleeding, vaginal discharge and vaginal pain.  Musculoskeletal: Negative for arthralgias, back pain, gait problem, joint swelling, myalgias, neck pain and neck stiffness.  Skin: Negative for color change, pallor, rash and wound.  Allergic/Immunologic: Negative for environmental allergies, food allergies and immunocompromised state.  Neurological: Negative for dizziness, tremors, seizures, syncope, facial asymmetry, speech difficulty, weakness, light-headedness, numbness and headaches.  Hematological: Negative for adenopathy. Does not bruise/bleed easily.  Psychiatric/Behavioral: Positive for sleep disturbance. Negative for agitation, behavioral problems, confusion, decreased concentration, dysphoric mood, hallucinations, self-injury and suicidal ideas. The patient is nervous/anxious. The patient is not hyperactive.     Objective:  BP (!) 154/86    Pulse 77   Temp 98.5 F (36.9 C) (Oral)   Resp 20   Ht 5' 1.02" (1.55 m)   Wt 161 lb 9.6 oz (73.3 kg)   SpO2 98%   BMI 30.51 kg/m   Physical Exam  Constitutional: She is oriented to person, place, and time. She appears well-developed and well-nourished. No distress.  HENT:  Head: Normocephalic and atraumatic.  Right Ear: Hearing, tympanic membrane, external ear and ear canal normal.  Left Ear: Hearing, tympanic membrane, external ear and ear canal normal.  Nose: Nose normal.  Mouth/Throat: Uvula is midline, oropharynx is clear and moist and mucous membranes are normal. No oropharyngeal exudate.  Bilateral ear canals impacted with copious amount of dark brown dry cerumen, unable to visualize TMs.   Post bilateral ear lavage, ear canals are clear.  TMs are visualized and are normal in appearance.  No erythema, bulging, or perforation of bilateral TMs noted.   Eyes: Pupils are equal, round, and reactive to light. Conjunctivae, EOM and lids are normal. No scleral icterus.  Neck: Trachea normal and normal  range of motion. No thyroid mass and no thyromegaly present.  Cardiovascular: Normal rate, regular rhythm, normal heart sounds and intact distal pulses.  Pulmonary/Chest: Effort normal and breath sounds normal.  Abdominal: Soft. Normal appearance and bowel sounds are normal. There is no tenderness.  Lymphadenopathy:       Head (right side): No tonsillar, no preauricular, no posterior auricular and no occipital adenopathy present.       Head (left side): No tonsillar, no preauricular, no posterior auricular and no occipital adenopathy present.    She has no cervical adenopathy.       Right: No supraclavicular adenopathy present.       Left: No supraclavicular adenopathy present.  Neurological: She is alert and oriented to person, place, and time. She has normal strength and normal reflexes.  Skin: Skin is warm and dry.       Visual Acuity Screening   Right eye Left eye Both eyes    Without correction: '20/70 20/70 20/50 '  With correction:        Wt Readings from Last 3 Encounters:  05/25/18 161 lb 9.6 oz (73.3 kg)  07/27/17 158 lb 6.4 oz (71.8 kg)  04/06/17 154 lb 3.2 oz (69.9 kg)   BP Readings from Last 3 Encounters:  05/25/18 (!) 154/86  07/27/17 130/86  04/06/17 134/81   Depression screen PHQ 2/9 05/25/2018 07/27/2017 03/23/2017 08/20/2015 12/28/2014  Decreased Interest 0 0 '3 3 2  ' Down, Depressed, Hopeless 0 0 '3 3 2  ' PHQ - 2 Score 0 0 '6 6 4  ' Altered sleeping - - '3 3 3  ' Tired, decreased energy - - '3 3 3  ' Change in appetite - - 0 2 3  Feeling bad or failure about yourself  - - 1 0 0  Trouble concentrating - - '3 3 3  ' Moving slowly or fidgety/restless - - '3 2 1  ' Suicidal thoughts - - 0 0 0  PHQ-9 Score - - '19 19 17  ' Difficult doing work/chores - - - Somewhat difficult Somewhat difficult     Assessment and Plan :  Discussed healthy lifestyle, diet, exercise, preventative care, vaccinations, and addressed patient's concerns. Plan for follow up as needed.  Otherwise, plan for specific conditions below. 1. Annual physical exam Await lab results.   2. Prediabetes - CMP14+EGFR - Hemoglobin A1c  3. Screening for deficiency anemia - CBC with Differential/Platelet  4. Screening for hematuria or proteinuria - Urinalysis, dipstick only  5. Screening cholesterol level - Lipid panel  6. Needs flu shot - Flu Vaccine QUAD 36+ mos IM  7. Screening for thyroid disorder - TSH  8. Screen for colon cancer - Ambulatory referral to Gastroenterology  9. Bilateral impacted cerumen Resolved.  - Ear wax removal  10. Elevated blood-pressure reading without diagnosis of hypertension Asymptomatic. Instructed to check bp outside of office over the next couple of weeks. Return if consistently >140/90. Given strict ED precautions.   11. Constipation, unspecified constipation type Discussed healthy lifestyle modifications to aid in relieving constipation. Also  given education for OTC pharm tx.     Tenna Delaine, PA-C  [facility] 05/25/2018 5:17 PM

## 2018-05-25 NOTE — Patient Instructions (Addendum)
In terms of elevated blood pressure, I would like you to check your blood pressure at least a couple times over the next week outside of the office and document these values. It is best if you check the blood pressure at different times in the day. Your goal is <140/90. If your values are consistently above this goal, please return to office for further evaluation. If you start to have chest pain, blurred vision, shortness of breath, severe headache, lower leg swelling, or nausea/vomiting please seek care immediately here or at the ED.     To help reduce constipation and promote bowel health: 1. Drink at least 64 ounces of water each day 2. Eat plenty of fiber (fruits, vegetables, whole grains, legumes) 3. Be physically active or exercise including walking, jogging, swimming, yoga, etc 4. For active constipation use a stool softener (docusate) or an osmotic laxative (like Miralax) each day, or as needed   If your stools are hard or are formed balls or you have to strain a stool softener will help - use colace 2-3 capsule a day  For gentle treatment of constipation Use Miralax 1-2 capfuls a day until your stools are soft and regular and then decrease the usage - you can use this daily  For more aggressive treatment of constipation Use 4 capfuls of Colace and 6 doses of Miralax and drink it in 2 hours - this should result in several watery stools - if it does not repeat the next day and then go to daily miralax for a week to make sure your bowels are clean and retrained to work properly  For the most aggressive treatment of constipation Use 14 capfuls of Miralax in 1 gallon of fluid (gatoraid or water work well or a combination of the two) and drink over 12h - it is ok to eat during this time and then use Miralax 1 capful daily for about 2 weeks to prevent the constipation from returning  Health Maintenance for Postmenopausal Women Menopause is a normal process in which your reproductive ability  comes to an end. This process happens gradually over a span of months to years, usually between the ages of 79 and 48. Menopause is complete when you have missed 12 consecutive menstrual periods. It is important to talk with your health care provider about some of the most common conditions that affect postmenopausal women, such as heart disease, cancer, and bone loss (osteoporosis). Adopting a healthy lifestyle and getting preventive care can help to promote your health and wellness. Those actions can also lower your chances of developing some of these common conditions. What should I know about menopause? During menopause, you may experience a number of symptoms, such as:  Moderate-to-severe hot flashes.  Night sweats.  Decrease in sex drive.  Mood swings.  Headaches.  Tiredness.  Irritability.  Memory problems.  Insomnia.  Choosing to treat or not to treat menopausal changes is an individual decision that you make with your health care provider. What should I know about hormone replacement therapy and supplements? Hormone therapy products are effective for treating symptoms that are associated with menopause, such as hot flashes and night sweats. Hormone replacement carries certain risks, especially as you become older. If you are thinking about using estrogen or estrogen with progestin treatments, discuss the benefits and risks with your health care provider. What should I know about heart disease and stroke? Heart disease, heart attack, and stroke become more likely as you age. This may be due, in  part, to the hormonal changes that your body experiences during menopause. These can affect how your body processes dietary fats, triglycerides, and cholesterol. Heart attack and stroke are both medical emergencies. There are many things that you can do to help prevent heart disease and stroke:  Have your blood pressure checked at least every 1-2 years. High blood pressure causes heart  disease and increases the risk of stroke.  If you are 50-71 years old, ask your health care provider if you should take aspirin to prevent a heart attack or a stroke.  Do not use any tobacco products, including cigarettes, chewing tobacco, or electronic cigarettes. If you need help quitting, ask your health care provider.  It is important to eat a healthy diet and maintain a healthy weight. ? Be sure to include plenty of vegetables, fruits, low-fat dairy products, and lean protein. ? Avoid eating foods that are high in solid fats, added sugars, or salt (sodium).  Get regular exercise. This is one of the most important things that you can do for your health. ? Try to exercise for at least 150 minutes each week. The type of exercise that you do should increase your heart rate and make you sweat. This is known as moderate-intensity exercise. ? Try to do strengthening exercises at least twice each week. Do these in addition to the moderate-intensity exercise.  Know your numbers.Ask your health care provider to check your cholesterol and your blood glucose. Continue to have your blood tested as directed by your health care provider.  What should I know about cancer screening? There are several types of cancer. Take the following steps to reduce your risk and to catch any cancer development as early as possible. Breast Cancer  Practice breast self-awareness. ? This means understanding how your breasts normally appear and feel. ? It also means doing regular breast self-exams. Let your health care provider know about any changes, no matter how small.  If you are 43 or older, have a clinician do a breast exam (clinical breast exam or CBE) every year. Depending on your age, family history, and medical history, it may be recommended that you also have a yearly breast X-ray (mammogram).  If you have a family history of breast cancer, talk with your health care provider about genetic screening.  If  you are at high risk for breast cancer, talk with your health care provider about having an MRI and a mammogram every year.  Breast cancer (BRCA) gene test is recommended for women who have family members with BRCA-related cancers. Results of the assessment will determine the need for genetic counseling and BRCA1 and for BRCA2 testing. BRCA-related cancers include these types: ? Breast. This occurs in males or females. ? Ovarian. ? Tubal. This may also be called fallopian tube cancer. ? Cancer of the abdominal or pelvic lining (peritoneal cancer). ? Prostate. ? Pancreatic.  Cervical, Uterine, and Ovarian Cancer Your health care provider may recommend that you be screened regularly for cancer of the pelvic organs. These include your ovaries, uterus, and vagina. This screening involves a pelvic exam, which includes checking for microscopic changes to the surface of your cervix (Pap test).  For women ages 21-65, health care providers may recommend a pelvic exam and a Pap test every three years. For women ages 59-65, they may recommend the Pap test and pelvic exam, combined with testing for human papilloma virus (HPV), every five years. Some types of HPV increase your risk of cervical cancer. Testing for  HPV may also be done on women of any age who have unclear Pap test results.  Other health care providers may not recommend any screening for nonpregnant women who are considered low risk for pelvic cancer and have no symptoms. Ask your health care provider if a screening pelvic exam is right for you.  If you have had past treatment for cervical cancer or a condition that could lead to cancer, you need Pap tests and screening for cancer for at least 20 years after your treatment. If Pap tests have been discontinued for you, your risk factors (such as having a new sexual partner) need to be reassessed to determine if you should start having screenings again. Some women have medical problems that increase  the chance of getting cervical cancer. In these cases, your health care provider may recommend that you have screening and Pap tests more often.  If you have a family history of uterine cancer or ovarian cancer, talk with your health care provider about genetic screening.  If you have vaginal bleeding after reaching menopause, tell your health care provider.  There are currently no reliable tests available to screen for ovarian cancer.  Lung Cancer Lung cancer screening is recommended for adults 79-56 years old who are at high risk for lung cancer because of a history of smoking. A yearly low-dose CT scan of the lungs is recommended if you:  Currently smoke.  Have a history of at least 30 pack-years of smoking and you currently smoke or have quit within the past 15 years. A pack-year is smoking an average of one pack of cigarettes per day for one year.  Yearly screening should:  Continue until it has been 15 years since you quit.  Stop if you develop a health problem that would prevent you from having lung cancer treatment.  Colorectal Cancer  This type of cancer can be detected and can often be prevented.  Routine colorectal cancer screening usually begins at age 80 and continues through age 55.  If you have risk factors for colon cancer, your health care provider may recommend that you be screened at an earlier age.  If you have a family history of colorectal cancer, talk with your health care provider about genetic screening.  Your health care provider may also recommend using home test kits to check for hidden blood in your stool.  A small camera at the end of a tube can be used to examine your colon directly (sigmoidoscopy or colonoscopy). This is done to check for the earliest forms of colorectal cancer.  Direct examination of the colon should be repeated every 5-10 years until age 46. However, if early forms of precancerous polyps or small growths are found or if you have a  family history or genetic risk for colorectal cancer, you may need to be screened more often.  Skin Cancer  Check your skin from head to toe regularly.  Monitor any moles. Be sure to tell your health care provider: ? About any new moles or changes in moles, especially if there is a change in a mole's shape or color. ? If you have a mole that is larger than the size of a pencil eraser.  If any of your family members has a history of skin cancer, especially at a young age, talk with your health care provider about genetic screening.  Always use sunscreen. Apply sunscreen liberally and repeatedly throughout the day.  Whenever you are outside, protect yourself by wearing long sleeves,  pants, a wide-brimmed hat, and sunglasses.  What should I know about osteoporosis? Osteoporosis is a condition in which bone destruction happens more quickly than new bone creation. After menopause, you may be at an increased risk for osteoporosis. To help prevent osteoporosis or the bone fractures that can happen because of osteoporosis, the following is recommended:  If you are 61-36 years old, get at least 1,000 mg of calcium and at least 600 mg of vitamin D per day.  If you are older than age 67 but younger than age 46, get at least 1,200 mg of calcium and at least 600 mg of vitamin D per day.  If you are older than age 11, get at least 1,200 mg of calcium and at least 800 mg of vitamin D per day.  Smoking and excessive alcohol intake increase the risk of osteoporosis. Eat foods that are rich in calcium and vitamin D, and do weight-bearing exercises several times each week as directed by your health care provider. What should I know about how menopause affects my mental health? Depression may occur at any age, but it is more common as you become older. Common symptoms of depression include:  Low or sad mood.  Changes in sleep patterns.  Changes in appetite or eating patterns.  Feeling an overall lack  of motivation or enjoyment of activities that you previously enjoyed.  Frequent crying spells.  Talk with your health care provider if you think that you are experiencing depression. What should I know about immunizations? It is important that you get and maintain your immunizations. These include:  Tetanus, diphtheria, and pertussis (Tdap) booster vaccine.  Influenza every year before the flu season begins.  Pneumonia vaccine.  Shingles vaccine.  Your health care provider may also recommend other immunizations. This information is not intended to replace advice given to you by your health care provider. Make sure you discuss any questions you have with your health care provider. Document Released: 10/17/2005 Document Revised: 03/14/2016 Document Reviewed: 05/29/2015 Elsevier Interactive Patient Education  2018 [employer] Eating Plan DASH stands for "Dietary Approaches to Stop Hypertension." The DASH eating plan is a healthy eating plan that has been shown to reduce high blood pressure (hypertension). It may also reduce your risk for type 2 diabetes, heart disease, and stroke. The DASH eating plan may also help with weight loss. What are tips for following this plan? General guidelines  Avoid eating more than 2,300 mg (milligrams) of salt (sodium) a day. If you have hypertension, you may need to reduce your sodium intake to 1,500 mg a day.  Limit alcohol intake to no more than 1 drink a day for nonpregnant women and 2 drinks a day for men. One drink equals 12 oz of beer, 5 oz of wine, or 1 oz of hard liquor.  Work with your health care provider to maintain a healthy body weight or to lose weight. Ask what an ideal weight is for you.  Get at least 30 minutes of exercise that causes your heart to beat faster (aerobic exercise) most days of the week. Activities may include walking, swimming, or biking.  Work with your health care provider or diet and nutrition specialist  (dietitian) to adjust your eating plan to your individual calorie needs. Reading food labels  Check food labels for the amount of sodium per serving. Choose foods with less than 5 percent of the Daily Value of sodium. Generally, foods with less than 300 mg of sodium per  serving fit into this eating plan.  To find whole grains, look for the word "whole" as the first word in the ingredient list. Shopping  Buy products labeled as "low-sodium" or "no salt added."  Buy fresh foods. Avoid canned foods and premade or frozen meals. Cooking  Avoid adding salt when cooking. Use salt-free seasonings or herbs instead of table salt or sea salt. Check with your health care provider or pharmacist before using salt substitutes.  Do not fry foods. Cook foods using healthy methods such as baking, boiling, grilling, and broiling instead.  Cook with heart-healthy oils, such as olive, canola, soybean, or sunflower oil. Meal planning   Eat a balanced diet that includes: ? 5 or more servings of fruits and vegetables each day. At each meal, try to fill half of your plate with fruits and vegetables. ? Up to 6-8 servings of whole grains each day. ? Less than 6 oz of lean meat, poultry, or fish each day. A 3-oz serving of meat is about the same size as a deck of cards. One egg equals 1 oz. ? 2 servings of low-fat dairy each day. ? A serving of nuts, seeds, or beans 5 times each week. ? Heart-healthy fats. Healthy fats called Omega-3 fatty acids are found in foods such as flaxseeds and coldwater fish, like sardines, salmon, and mackerel.  Limit how much you eat of the following: ? Canned or prepackaged foods. ? Food that is high in trans fat, such as fried foods. ? Food that is high in saturated fat, such as fatty meat. ? Sweets, desserts, sugary drinks, and other foods with added sugar. ? Full-fat dairy products.  Do not salt foods before eating.  Try to eat at least 2 vegetarian meals each week.  Eat  more home-cooked food and less restaurant, buffet, and fast food.  When eating at a restaurant, ask that your food be prepared with less salt or no salt, if possible. What foods are recommended? The items listed may not be a complete list. Talk with your dietitian about what dietary choices are best for you. Grains Whole-grain or whole-wheat bread. Whole-grain or whole-wheat pasta. Brown rice. [place]. Bulgur. Whole-grain and low-sodium cereals. Pita bread. Low-fat, low-sodium crackers. Whole-wheat flour tortillas. Vegetables Fresh or frozen vegetables (raw, steamed, roasted, or grilled). Low-sodium or reduced-sodium tomato and vegetable juice. Low-sodium or reduced-sodium tomato sauce and tomato paste. Low-sodium or reduced-sodium canned vegetables. Fruits All fresh, dried, or frozen fruit. Canned fruit in natural juice (without added sugar). Meat and other protein foods Skinless chicken or [place]. Ground chicken or [place]. Pork with fat trimmed off. Fish and seafood. Egg whites. Dried beans, peas, or lentils. Unsalted nuts, nut butters, and seeds. Unsalted canned beans. Lean cuts of beef with fat trimmed off. Low-sodium, lean deli meat. Dairy Low-fat (1%) or fat-free (skim) milk. Fat-free, low-fat, or reduced-fat cheeses. Nonfat, low-sodium ricotta or cottage cheese. Low-fat or nonfat yogurt. Low-fat, low-sodium cheese. Fats and oils Soft margarine without trans fats. Vegetable oil. Low-fat, reduced-fat, or light mayonnaise and salad dressings (reduced-sodium). Canola, safflower, olive, soybean, and sunflower oils. Avocado. Seasoning and other foods Herbs. Spices. Seasoning mixes without salt. Unsalted popcorn and pretzels. Fat-free sweets. What foods are not recommended? The items listed may not be a complete list. Talk with your dietitian about what dietary choices are best for you. Grains Baked goods made with fat, such as croissants, muffins, or some breads. Dry pasta or rice meal  packs. Vegetables Creamed or fried vegetables. Vegetables  in a cheese sauce. Regular canned vegetables (not low-sodium or reduced-sodium). Regular canned tomato sauce and paste (not low-sodium or reduced-sodium). Regular tomato and vegetable juice (not low-sodium or reduced-sodium). Angie Fava. Olives. Fruits Canned fruit in a light or heavy syrup. Fried fruit. Fruit in cream or butter sauce. Meat and other protein foods Fatty cuts of meat. Ribs. Fried meat. Berniece Salines. Sausage. Bologna and other processed lunch meats. Salami. Fatback. Hotdogs. Bratwurst. Salted nuts and seeds. Canned beans with added salt. Canned or smoked fish. Whole eggs or egg yolks. Chicken or [place] with skin. Dairy Whole or 2% milk, cream, and half-and-half. Whole or full-fat cream cheese. Whole-fat or sweetened yogurt. Full-fat cheese. Nondairy creamers. Whipped toppings. Processed cheese and cheese spreads. Fats and oils Butter. Stick margarine. Lard. Shortening. Ghee. Bacon fat. Tropical oils, such as coconut, palm kernel, or palm oil. Seasoning and other foods Salted popcorn and pretzels. Onion salt, garlic salt, seasoned salt, table salt, and sea salt. Worcestershire sauce. Tartar sauce. Barbecue sauce. Teriyaki sauce. Soy sauce, including reduced-sodium. Steak sauce. Canned and packaged gravies. Fish sauce. Oyster sauce. Cocktail sauce. Horseradish that you find on the shelf. Ketchup. Mustard. Meat flavorings and tenderizers. Bouillon cubes. Hot sauce and Tabasco sauce. Premade or packaged marinades. Premade or packaged taco seasonings. Relishes. Regular salad dressings. Where to find more information:  National Heart, Lung, and Reader: https://wilson-eaton.com/  American Heart Association: www.heart.org Summary  The DASH eating plan is a healthy eating plan that has been shown to reduce high blood pressure (hypertension). It may also reduce your risk for type 2 diabetes, heart disease, and stroke.  With the DASH eating  plan, you should limit salt (sodium) intake to 2,300 mg a day. If you have hypertension, you may need to reduce your sodium intake to 1,500 mg a day.  When on the DASH eating plan, aim to eat more fresh fruits and vegetables, whole grains, lean proteins, low-fat dairy, and heart-healthy fats.  Work with your health care provider or diet and nutrition specialist (dietitian) to adjust your eating plan to your individual calorie needs. This information is not intended to replace advice given to you by your health care provider. Make sure you discuss any questions you have with your health care provider. Document Released: 08/14/2011 Document Revised: 08/18/2016 Document Reviewed: 08/18/2016 Elsevier Interactive Patient Education  [employer].

## 2018-05-26 LAB — CMP14+EGFR
ALK PHOS: 128 IU/L — AB (ref 39–117)
ALT: 23 IU/L (ref 0–32)
AST: 23 IU/L (ref 0–40)
Albumin/Globulin Ratio: 1.8 (ref 1.2–2.2)
Albumin: 4.6 g/dL (ref 3.5–5.5)
BUN/Creatinine Ratio: 16 (ref 9–23)
BUN: 10 mg/dL (ref 6–24)
Bilirubin Total: 0.2 mg/dL (ref 0.0–1.2)
CO2: 23 mmol/L (ref 20–29)
Calcium: 10.3 mg/dL — ABNORMAL HIGH (ref 8.7–10.2)
Chloride: 102 mmol/L (ref 96–106)
Creatinine, Ser: 0.64 mg/dL (ref 0.57–1.00)
GFR calc Af Amer: 115 mL/min/{1.73_m2} (ref >59)
GFR, EST NON AFRICAN AMERICAN: 100 mL/min/{1.73_m2} (ref >59)
GLOBULIN, TOTAL: 2.5 g/dL (ref 1.5–4.5)
Glucose: 86 mg/dL (ref 65–99)
POTASSIUM: 4.7 mmol/L (ref 3.5–5.2)
SODIUM: 140 mmol/L (ref 134–144)
Total Protein: 7.1 g/dL (ref 6.0–8.5)

## 2018-05-26 LAB — URINALYSIS, DIPSTICK ONLY
Bilirubin, UA: NEGATIVE
Glucose, UA: NEGATIVE
KETONES UA: NEGATIVE
LEUKOCYTES UA: NEGATIVE
NITRITE UA: NEGATIVE
PH UA: 6.5 (ref 5.0–7.5)
Protein, UA: NEGATIVE
Specific Gravity, UA: 1.009 (ref 1.005–1.030)
Urobilinogen, Ur: 0.2 mg/dL (ref 0.2–1.0)

## 2018-05-26 LAB — CBC WITH DIFFERENTIAL/PLATELET
BASOS ABS: 0.1 10*3/uL (ref 0.0–0.2)
Basos: 1 %
EOS (ABSOLUTE): 0.2 10*3/uL (ref 0.0–0.4)
EOS: 1 %
HEMATOCRIT: 40.7 % (ref 34.0–46.6)
Hemoglobin: 14 g/dL (ref 11.1–15.9)
Immature Grans (Abs): 0 10*3/uL (ref 0.0–0.1)
Immature Granulocytes: 0 %
LYMPHS ABS: 5.6 10*3/uL — AB (ref 0.7–3.1)
Lymphs: 45 %
MCH: 31.7 pg (ref 26.6–33.0)
MCHC: 34.4 g/dL (ref 31.5–35.7)
MCV: 92 fL (ref 79–97)
Monocytes Absolute: 1.2 10*3/uL — ABNORMAL HIGH (ref 0.1–0.9)
Monocytes: 9 %
Neutrophils Absolute: 5.6 10*3/uL (ref 1.4–7.0)
Neutrophils: 44 %
Platelets: 406 10*3/uL (ref 150–450)
RBC: 4.42 x10E6/uL (ref 3.77–5.28)
RDW: 13.5 % (ref 12.3–15.4)
WBC: 12.7 10*3/uL — AB (ref 3.4–10.8)

## 2018-05-26 LAB — LIPID PANEL
CHOL/HDL RATIO: 4.2 ratio (ref 0.0–4.4)
CHOLESTEROL TOTAL: 208 mg/dL — AB (ref 100–199)
HDL: 50 mg/dL (ref >39)
LDL Calculated: 128 mg/dL — ABNORMAL HIGH (ref 0–99)
TRIGLYCERIDES: 149 mg/dL (ref 0–149)
VLDL Cholesterol Cal: 30 mg/dL (ref 5–40)

## 2018-05-26 LAB — TSH: TSH: 2.76 u[IU]/mL (ref 0.450–4.500)

## 2018-05-26 LAB — HEMOGLOBIN A1C
Est. average glucose Bld gHb Est-mCnc: 126 mg/dL
Hgb A1c MFr Bld: 6 % — ABNORMAL HIGH (ref 4.8–5.6)

## 2018-06-08 ENCOUNTER — Ambulatory Visit
Admission: RE | Admit: 2018-06-08 | Discharge: 2018-06-08 | Disposition: A | Discharge status: Home / Self Care | Payer: BLUE CROSS/BLUE SHIELD | Source: Ambulatory Visit | Attending: Physician Assistant | Admitting: Physician Assistant

## 2018-06-08 DIAGNOSIS — Z1231 Encounter for screening mammogram for malignant neoplasm of breast: Secondary | ICD-10-CM | POA: Diagnosis not present

## 2018-06-09 ENCOUNTER — Encounter: Payer: Self-pay | Admitting: Gastroenterology

## 2018-06-14 ENCOUNTER — Encounter: Payer: Self-pay | Admitting: Physician Assistant

## 2018-07-05 ENCOUNTER — Encounter: Payer: Self-pay | Admitting: Gastroenterology

## 2018-07-05 ENCOUNTER — Ambulatory Visit (AMBULATORY_SURGERY_CENTER): Payer: BLUE CROSS/BLUE SHIELD

## 2018-07-05 VITALS — Ht 62.0 in | Wt 162.8 lb

## 2018-07-05 DIAGNOSIS — Z1211 Encounter for screening for malignant neoplasm of colon: Secondary | ICD-10-CM

## 2018-07-05 NOTE — Progress Notes (Signed)
Denies allergies to eggs or soy products. Denies complication of anesthesia or sedation. Denies use of weight loss medication. Denies use of O2.   Emmi instructions declined.  

## 2018-07-19 ENCOUNTER — Other Ambulatory Visit: Payer: Self-pay

## 2018-07-19 ENCOUNTER — Encounter: Payer: Self-pay | Admitting: Gastroenterology

## 2018-07-19 ENCOUNTER — Ambulatory Visit (AMBULATORY_SURGERY_CENTER): Payer: BLUE CROSS/BLUE SHIELD | Admitting: Gastroenterology

## 2018-07-19 VITALS — BP 163/83 | HR 72 | Temp 98.2°F | Resp 19 | Ht 62.0 in | Wt 162.0 lb

## 2018-07-19 DIAGNOSIS — K635 Polyp of colon: Secondary | ICD-10-CM | POA: Diagnosis not present

## 2018-07-19 DIAGNOSIS — Z1211 Encounter for screening for malignant neoplasm of colon: Secondary | ICD-10-CM | POA: Diagnosis not present

## 2018-07-19 DIAGNOSIS — D123 Benign neoplasm of transverse colon: Secondary | ICD-10-CM

## 2018-07-19 DIAGNOSIS — D124 Benign neoplasm of descending colon: Secondary | ICD-10-CM

## 2018-07-19 MED ORDER — SODIUM CHLORIDE 0.9 % IV SOLN: 500.0000 mL | Freq: Once | INTRAVENOUS | Status: DC

## 2018-07-19 NOTE — Progress Notes (Signed)
Called to room to assist during endoscopic procedure.  Patient ID and intended procedure confirmed with present staff. Received instructions for my participation in the procedure from the performing physician.  

## 2018-07-19 NOTE — Op Note (Signed)
[place] Patient Name: Melanie Montgomery Procedure Date: 07/19/2018 11:21 AM MRN: [id number] Endoscopist: Thornton Park MD, MD Age: 57 Referring MD:  Date of Birth: 1961/02/06 Gender: Female Account #: [id number] Procedure:                Colonoscopy Indications:              Screening for colorectal malignant neoplasm. No                            family history of colon cancer or polyps. No                            baseline GI symptoms. Medicines:                See the Anesthesia note for documentation of the                            administered medications Procedure:                Pre-Anesthesia Assessment:                           - Prior to the procedure, a History and Physical                            was performed, and patient medications and                            allergies were reviewed. The patient's tolerance of                            previous anesthesia was also reviewed. The risks                            and benefits of the procedure and the sedation                            options and risks were discussed with the patient.                            All questions were answered, and informed consent                            was obtained. Prior Anticoagulants: The patient has                            taken no previous anticoagulant or antiplatelet                            agents. ASA Grade Assessment: II - A patient with                            mild systemic disease. After reviewing the risks  and benefits, the patient was deemed in                            satisfactory condition to undergo the procedure.                           After obtaining informed consent, the colonoscope                            was passed under direct vision. Throughout the                            procedure, the patient's blood pressure, pulse, and                            oxygen saturations were monitored  continuously. The                            Colonoscope was introduced through the anus and                            advanced to the the cecum, identified by the                            appendiceal orifice, ileocecal valve and palpation.                            The patient tolerated the procedure well. The                            colonoscopy was performed with moderate difficulty                            due to a redundant colon and significant looping.                            Successful completion of the procedure was aided by                            applying abdominal pressure. Scope In: 11:28:02 AM Scope Out: 11:45:37 AM Scope Withdrawal Time: 0 hours 12 minutes 37 seconds  Total Procedure Duration: 0 hours 17 minutes 35 seconds  Findings:                 The perianal and digital rectal examinations were                            normal.                           A 3 mm polyp was found in the transverse colon. The                            polyp was sessile. The polyp was removed with a  cold snare. Resection and retrieval were complete.                           A 2 mm polyp was found in the descending colon. The                            polyp was sessile. The polyp was removed with a                            cold biopsy forceps. Resection and retrieval were                            complete.                           The exam was otherwise without abnormality on                            direct and retroflexion views. Complications:            No immediate complications. Estimated Blood Loss:     Estimated blood loss: none. Impression:               - One 3 mm polyp in the transverse colon, removed                            with a cold snare. Resected and retrieved.                           - One 2 mm polyp in the descending colon, removed                            with a cold biopsy forceps. Resected and retrieved.                            - The examination was otherwise normal on direct                            and retroflexion views. Recommendation:           - Discharge patient to home.                           - Resume regular diet today.                           - Continue present medications.                           - Await pathology results.                           - Repeat colonoscopy in 5 years for surveillance if                            at least one polyp  is adenomatous. If both polyps                            are benign continue with routine colon cancer                            screening. Thornton Park MD, MD 07/19/2018 11:51:57 AM This report has been signed electronically.

## 2018-07-19 NOTE — Patient Instructions (Signed)
Handouts provided:  Polyps  YOU HAD AN ENDOSCOPIC PROCEDURE TODAY AT THE [facility]:   Refer to the procedure report that was given to you for any specific questions about what was found during the examination.  If the procedure report does not answer your questions, please call your gastroenterologist to clarify.  If you requested that your care partner not be given the details of your procedure findings, then the procedure report has been included in a sealed envelope for you to review at your convenience later.  YOU SHOULD EXPECT: Some feelings of bloating in the abdomen. Passage of more gas than usual.  Walking can help get rid of the air that was put into your GI tract during the procedure and reduce the bloating. If you had a lower endoscopy (such as a colonoscopy or flexible sigmoidoscopy) you may notice spotting of blood in your stool or on the toilet paper. If you underwent a bowel prep for your procedure, you may not have a normal bowel movement for a few days.  Please Note:  You might notice some irritation and congestion in your nose or some drainage.  This is from the oxygen used during your procedure.  There is no need for concern and it should clear up in a day or so.  SYMPTOMS TO REPORT IMMEDIATELY:   Following lower endoscopy (colonoscopy or flexible sigmoidoscopy):  Excessive amounts of blood in the stool  Significant tenderness or worsening of abdominal pains  Swelling of the abdomen that is new, acute  Fever of 100F or higher  For urgent or emergent issues, a gastroenterologist can be reached at any hour by calling [phone].   DIET:  We do recommend a small meal at first, but then you may proceed to your regular diet.  Drink plenty of fluids but you should avoid alcoholic beverages for 24 hours.  ACTIVITY:  You should plan to take it easy for the rest of today and you should NOT DRIVE or use heavy machinery until tomorrow (because of the sedation  medicines used during the test).    FOLLOW UP: Our staff will call the number listed on your records the next business day following your procedure to check on you and address any questions or concerns that you may have regarding the information given to you following your procedure. If we do not reach you, we will leave a message.  However, if you are feeling well and you are not experiencing any problems, there is no need to return our call.  We will assume that you have returned to your regular daily activities without incident.  If any biopsies were taken you will be contacted by phone or by letter within the next 1-3 weeks.  Please call us at [phone] if you have not heard about the biopsies in 3 weeks.    SIGNATURES/CONFIDENTIALITY: You and/or your care partner have signed paperwork which will be entered into your electronic medical record.  These signatures attest to the fact that that the information above on your After Visit Summary has been reviewed and is understood.  Full responsibility of the confidentiality of this discharge information lies with you and/or your care-partner.  

## 2018-07-19 NOTE — Progress Notes (Signed)
Pt's states no medical or surgical changes since previsit or office visit. 

## 2018-07-19 NOTE — Progress Notes (Signed)
PT taken to PACU. Monitors in place. VSS. Report given to RN. 

## 2018-07-20 ENCOUNTER — Telehealth: Payer: Self-pay | Admitting: *Deleted

## 2018-07-20 NOTE — Telephone Encounter (Signed)
  Follow up Call-  Call back number 07/19/2018  Post procedure Call Back phone  # [phone]  Permission to leave phone message Yes  Some recent data might be hidden     Patient questions:  Do you have a fever, pain , or abdominal swelling? No. Pain Score  0 *  Have you tolerated food without any problems? Yes.    Have you been able to return to your normal activities? Yes.    Do you have any questions about your discharge instructions: Diet   No. Medications  No. Follow up visit  No.  Do you have questions or concerns about your Care? No.  Actions: * If pain score is 4 or above: No action needed, pain <4.

## 2018-08-08 ENCOUNTER — Encounter: Payer: Self-pay | Admitting: Gastroenterology

## 2018-12-28 DIAGNOSIS — M5431 Sciatica, right side: Secondary | ICD-10-CM | POA: Diagnosis not present

## 2018-12-28 DIAGNOSIS — M9903 Segmental and somatic dysfunction of lumbar region: Secondary | ICD-10-CM | POA: Diagnosis not present

## 2018-12-30 DIAGNOSIS — M9903 Segmental and somatic dysfunction of lumbar region: Secondary | ICD-10-CM | POA: Diagnosis not present

## 2018-12-30 DIAGNOSIS — M5431 Sciatica, right side: Secondary | ICD-10-CM | POA: Diagnosis not present

## 2019-01-04 DIAGNOSIS — M9903 Segmental and somatic dysfunction of lumbar region: Secondary | ICD-10-CM | POA: Diagnosis not present

## 2019-01-04 DIAGNOSIS — M5431 Sciatica, right side: Secondary | ICD-10-CM | POA: Diagnosis not present

## 2019-01-06 DIAGNOSIS — M9903 Segmental and somatic dysfunction of lumbar region: Secondary | ICD-10-CM | POA: Diagnosis not present

## 2019-01-06 DIAGNOSIS — M5431 Sciatica, right side: Secondary | ICD-10-CM | POA: Diagnosis not present

## 2019-01-11 DIAGNOSIS — M9903 Segmental and somatic dysfunction of lumbar region: Secondary | ICD-10-CM | POA: Diagnosis not present

## 2019-01-11 DIAGNOSIS — M5431 Sciatica, right side: Secondary | ICD-10-CM | POA: Diagnosis not present

## 2019-01-13 DIAGNOSIS — M5431 Sciatica, right side: Secondary | ICD-10-CM | POA: Diagnosis not present

## 2019-01-13 DIAGNOSIS — M9903 Segmental and somatic dysfunction of lumbar region: Secondary | ICD-10-CM | POA: Diagnosis not present

## 2019-01-18 DIAGNOSIS — M9903 Segmental and somatic dysfunction of lumbar region: Secondary | ICD-10-CM | POA: Diagnosis not present

## 2019-01-18 DIAGNOSIS — M5431 Sciatica, right side: Secondary | ICD-10-CM | POA: Diagnosis not present

## 2019-01-20 DIAGNOSIS — M5431 Sciatica, right side: Secondary | ICD-10-CM | POA: Diagnosis not present

## 2019-01-20 DIAGNOSIS — M9903 Segmental and somatic dysfunction of lumbar region: Secondary | ICD-10-CM | POA: Diagnosis not present

## 2019-01-25 DIAGNOSIS — M5431 Sciatica, right side: Secondary | ICD-10-CM | POA: Diagnosis not present

## 2019-01-25 DIAGNOSIS — M9903 Segmental and somatic dysfunction of lumbar region: Secondary | ICD-10-CM | POA: Diagnosis not present

## 2019-01-27 DIAGNOSIS — M5431 Sciatica, right side: Secondary | ICD-10-CM | POA: Diagnosis not present

## 2019-01-27 DIAGNOSIS — M9903 Segmental and somatic dysfunction of lumbar region: Secondary | ICD-10-CM | POA: Diagnosis not present

## 2019-02-01 DIAGNOSIS — M5431 Sciatica, right side: Secondary | ICD-10-CM | POA: Diagnosis not present

## 2019-02-01 DIAGNOSIS — M9903 Segmental and somatic dysfunction of lumbar region: Secondary | ICD-10-CM | POA: Diagnosis not present

## 2019-02-02 DIAGNOSIS — H40033 Anatomical narrow angle, bilateral: Secondary | ICD-10-CM | POA: Diagnosis not present

## 2019-02-03 DIAGNOSIS — M9903 Segmental and somatic dysfunction of lumbar region: Secondary | ICD-10-CM | POA: Diagnosis not present

## 2019-02-03 DIAGNOSIS — M5431 Sciatica, right side: Secondary | ICD-10-CM | POA: Diagnosis not present

## 2019-02-08 DIAGNOSIS — M5431 Sciatica, right side: Secondary | ICD-10-CM | POA: Diagnosis not present

## 2019-02-08 DIAGNOSIS — M9903 Segmental and somatic dysfunction of lumbar region: Secondary | ICD-10-CM | POA: Diagnosis not present

## 2019-02-24 DIAGNOSIS — M9903 Segmental and somatic dysfunction of lumbar region: Secondary | ICD-10-CM | POA: Diagnosis not present

## 2019-02-24 DIAGNOSIS — M5431 Sciatica, right side: Secondary | ICD-10-CM | POA: Diagnosis not present

## 2019-03-14 DIAGNOSIS — H40033 Anatomical narrow angle, bilateral: Secondary | ICD-10-CM | POA: Diagnosis not present

## 2019-03-22 DIAGNOSIS — M9903 Segmental and somatic dysfunction of lumbar region: Secondary | ICD-10-CM | POA: Diagnosis not present

## 2019-03-22 DIAGNOSIS — M5431 Sciatica, right side: Secondary | ICD-10-CM | POA: Diagnosis not present

## 2019-03-29 DIAGNOSIS — H40032 Anatomical narrow angle, left eye: Secondary | ICD-10-CM | POA: Diagnosis not present

## 2019-04-12 DIAGNOSIS — H3562 Retinal hemorrhage, left eye: Secondary | ICD-10-CM | POA: Diagnosis not present

## 2019-04-12 DIAGNOSIS — H1851 Endothelial corneal dystrophy: Secondary | ICD-10-CM | POA: Diagnosis not present

## 2019-04-12 DIAGNOSIS — Z9889 Other specified postprocedural states: Secondary | ICD-10-CM | POA: Diagnosis not present

## 2019-04-12 DIAGNOSIS — H40033 Anatomical narrow angle, bilateral: Secondary | ICD-10-CM | POA: Diagnosis not present

## 2019-04-19 DIAGNOSIS — M5431 Sciatica, right side: Secondary | ICD-10-CM | POA: Diagnosis not present

## 2019-04-19 DIAGNOSIS — M9903 Segmental and somatic dysfunction of lumbar region: Secondary | ICD-10-CM | POA: Diagnosis not present

## 2019-04-27 DIAGNOSIS — M9903 Segmental and somatic dysfunction of lumbar region: Secondary | ICD-10-CM | POA: Diagnosis not present

## 2019-04-27 DIAGNOSIS — M5431 Sciatica, right side: Secondary | ICD-10-CM | POA: Diagnosis not present

## 2019-05-05 ENCOUNTER — Other Ambulatory Visit: Payer: Self-pay | Admitting: Physician Assistant

## 2019-05-05 DIAGNOSIS — M9903 Segmental and somatic dysfunction of lumbar region: Secondary | ICD-10-CM | POA: Diagnosis not present

## 2019-05-05 DIAGNOSIS — M5431 Sciatica, right side: Secondary | ICD-10-CM | POA: Diagnosis not present

## 2019-05-05 DIAGNOSIS — Z1231 Encounter for screening mammogram for malignant neoplasm of breast: Secondary | ICD-10-CM

## 2019-05-09 DIAGNOSIS — M5431 Sciatica, right side: Secondary | ICD-10-CM | POA: Diagnosis not present

## 2019-05-09 DIAGNOSIS — M9903 Segmental and somatic dysfunction of lumbar region: Secondary | ICD-10-CM | POA: Diagnosis not present

## 2019-06-01 DIAGNOSIS — M5431 Sciatica, right side: Secondary | ICD-10-CM | POA: Diagnosis not present

## 2019-06-01 DIAGNOSIS — M9903 Segmental and somatic dysfunction of lumbar region: Secondary | ICD-10-CM | POA: Diagnosis not present

## 2019-06-09 ENCOUNTER — Ambulatory Visit: Payer: BLUE CROSS/BLUE SHIELD | Admitting: Family Medicine

## 2019-06-09 ENCOUNTER — Other Ambulatory Visit: Payer: Self-pay

## 2019-06-09 ENCOUNTER — Encounter: Payer: Self-pay | Admitting: Family Medicine

## 2019-06-09 VITALS — BP 152/91 | HR 67 | Temp 98.4°F | Ht 62.0 in | Wt 127.0 lb

## 2019-06-09 DIAGNOSIS — G5701 Lesion of sciatic nerve, right lower limb: Secondary | ICD-10-CM | POA: Diagnosis not present

## 2019-06-09 MED ORDER — CYCLOBENZAPRINE HCL 10 MG PO TABS: 10.0000 mg | ORAL_TABLET | Freq: Three times a day (TID) | ORAL | 0 refills | Status: DC | PRN

## 2019-06-09 MED ORDER — DICLOFENAC SODIUM 1 % TD GEL: 4.0000 g | Freq: Four times a day (QID) | TRANSDERMAL | 0 refills | Status: DC

## 2019-06-09 NOTE — Patient Instructions (Signed)
Piriformis Syndrome Rehab Ask your health care provider which exercises are safe for you. Do exercises exactly as told by your health care provider and adjust them as directed. It is normal to feel mild stretching, pulling, tightness, or discomfort as you do these exercises. Stop right away if you feel sudden pain or your pain gets worse. Do not begin these exercises until told by your health care provider. Stretching and range-of-motion exercises These exercises warm up your muscles and joints and improve the movement and flexibility of your hip and pelvis. The exercises also help to relieve pain, numbness, and tingling. Hip rotation This is an exercise in which you lie on your back and stretch the muscles that rotate your hip (hip rotators) to stretch your buttocks. 1. Lie on your back on a firm surface. 2. Pull your left / right knee toward your same shoulder with your left / right hand until your knee is pointing toward the ceiling. Hold your left / right ankle with your other hand. 3. Keeping your knee steady, gently pull your left / right ankle toward your other shoulder until you feel a stretch in your buttocks. 4. Hold this position for __________ seconds. Repeat __________ times. Complete this exercise __________ times a day. Hip extensor This is an exercise in which you lie on your back and pull your knee to your chest. 1. Lie on your back on a firm surface. Both of your legs should be straight. 2. Pull your left / right knee to your chest. Hold your leg in this position by holding onto the back of your thigh or the front of your knee. 3. Hold this position for __________ seconds. 4. Slowly return to the starting position. Repeat __________ times. Complete this exercise __________ times a day. Strengthening exercises These exercises build strength and endurance in your hip and thigh muscles. Endurance is the ability to use your muscles for a long time, even after they get tired.  Straight leg raises, side-lying This exercise strengthens the muscles that rotate the leg at the hip and move it away from your body (hip abductors). 1. Lie on your side with your left / right leg in the top position. Lie so your head, shoulder, knee, and hip line up. Bend your bottom knee to help you balance. 2. Lift your top leg 4-6 inches (10-15 cm) while keeping your toes pointed straight ahead. 3. Hold this position for __________ seconds. 4. Slowly lower your leg to the starting position. 5. Let your muscles relax completely after each repetition. Repeat __________ times. Complete this exercise __________ times a day. Hip abduction and rotation This is sometimes called quadruped (on hands and knees) exercises. 1. Get on your hands and knees on a firm, lightly padded surface. Your hands should be directly below your shoulders, and your knees should be directly below your hips. 2. Lift your left / right knee out to the side. Keep your knee bent. Do not twist your body. 3. Hold this position for __________ seconds. 4. Slowly lower your leg. Repeat __________ times. Complete this exercise __________ times a day. Straight leg raises, face-down This exercise stretches the muscles that move your hips away from the front of the pelvis (hip extensors). 1. Lie on your abdomen on a bed or a firm surface with a pillow under your hips. 2. Squeeze your buttocks muscles and lift your left / right leg about 4-6 inches (10-15 cm) off the bed. Do not let your back arch. 3. Hold   this position for __________ seconds. 4. Slowly lower your leg to the starting position. 5. Let your muscles relax completely after each repetition. Repeat __________ times. Complete this exercise __________ times a day. This information is not intended to replace advice given to you by your health care provider. Make sure you discuss any questions you have with your health care provider. Document Released: 08/25/2005 Document  Revised: 12/16/2018 Document Reviewed: 06/17/2018 Elsevier Patient Education  2020 [employer]  

## 2019-06-09 NOTE — Progress Notes (Signed)
10/1/20204:51 PM  Melanie Montgomery 07-14-1961, 58 y.o., female [id number]  Chief Complaint  Patient presents with  . Pain    taking advil daily for the pain, left leg radiating down the leg. Use to go to chiro for the pain, she was told tht she has lost muscle due to weight loss    HPI:   Patient is a 58 y.o. female with past medical history significant for prediabetes who presents today for left sided back pain  She has been seeing chiropractor originally for right sided low back pain in June, was doing well and then started having left buttock pain and goes down the entire left leg into her foot. No focal weakness, no changes in bowel or bladder function Chiropractor wonders if she lost too much muscle but patient reports high protein diet  Wt Readings from Last 3 Encounters:  06/09/19 127 lb (57.6 kg)  07/19/18 162 lb (73.5 kg)  07/05/18 162 lb 12.8 oz (73.8 kg)     Depression screen [facility] 2/9 06/09/2019 05/25/2018 07/27/2017  Decreased Interest 0 0 0  Down, Depressed, Hopeless 0 0 0  PHQ - 2 Score 0 0 0  Altered sleeping - - -  Tired, decreased energy - - -  Change in appetite - - -  Feeling bad or failure about yourself  - - -  Trouble concentrating - - -  Moving slowly or fidgety/restless - - -  Suicidal thoughts - - -  PHQ-9 Score - - -  Difficult doing work/chores - - -    Fall Risk  06/09/2019 05/25/2018 07/27/2017 04/06/2017 03/23/2017  Falls in the past year? 0 Yes No No No  Number falls in past yr: 0 - - - -  Injury with Fall? 0 - - - -     No Known Allergies  Prior to Admission medications   Medication Sig Start Date End Date Taking? Authorizing Provider  cyclobenzaprine (FLEXERIL) 5 MG tablet Take 1 tablet (5 mg total) 3 (three) times daily as needed by mouth for muscle spasms. 07/27/17  Yes Timmothy Euler, [place] D, PA-C  Multiple Vitamin (MULTIVITAMIN) tablet Take 1 tablet by mouth daily.   Yes [provider]    Past Medical History:  Diagnosis  Date  . Anemia   . Anxiety   . Blood transfusion without reported diagnosis   . Cataract   . Depression     Past Surgical History:  Procedure Laterality Date  . CESAREAN SECTION    . FRACTURE SURGERY    . SPLENECTOMY, TOTAL  1987  . TUBAL LIGATION      Social History   Tobacco Use  . Smoking status: Former Smoker    Packs/day: 2.00    Types: Cigarettes    Start date: 08/21/1981    Quit date: 01/02/2013    Years since quitting: 6.4  . Smokeless tobacco: Never Used  Substance Use Topics  . Alcohol use: No    Family History  Problem Relation Age of Onset  . Cancer Mother   . Diabetes Mother   . Breast cancer Mother 23  . Heart disease Father   . Diabetes Sister   . Colon cancer Sister   . Diabetes Brother   . Heart disease Brother   . Heart attack Brother   . Heart attack Brother 73  . Diabetes Sister   . Heart failure Sister   . Diabetes Sister   . Breast cancer Maternal Grandmother   . Esophageal cancer Maternal  Uncle   . Rectal cancer Neg Hx   . Stomach cancer Neg Hx     ROS Per hpi  OBJECTIVE:  Today's Vitals   06/09/19 1647  BP: (!) 152/91  Pulse: 67  Temp: 98.4 F (36.9 C)  SpO2: 98%  Weight: 127 lb (57.6 kg)  Height: 5\' 2"  (1.575 m)   Body mass index is 23.23 kg/m.   Physical Exam Vitals signs and nursing note reviewed.  Constitutional:      Appearance: She is well-developed.  HENT:     Head: Normocephalic and atraumatic.  Eyes:     General: No scleral icterus.    Conjunctiva/sclera: Conjunctivae normal.     Pupils: Pupils are equal, round, and reactive to light.  Neck:     Musculoskeletal: Neck supple.  Pulmonary:     Effort: Pulmonary effort is normal.  Musculoskeletal:     Lumbar back: Normal.     Comments: Right HIP FROM TTP over right piriformis, less TTP over GT, IT band and GM tendon Neg SLR Normal strength Normal DTRs  Skin:    General: Skin is warm and dry.  Neurological:     Mental Status: She is alert and  oriented to person, place, and time.     No results found for this or any previous visit (from the past 24 hour(s)).  No results found.   ASSESSMENT and PLAN  1. Piriformis syndrome of right side Discussed supportive measures, new meds r/se/b and RTC precautions. Patient educational handout given. - cyclobenzaprine (FLEXERIL) 10 MG tablet; Take 1 tablet (10 mg total) by mouth 3 (three) times daily as needed for muscle spasms. - diclofenac sodium (VOLTAREN) 1 % GEL; Apply 4 g topically 4 (four) times daily.  Return if symptoms worsen or fail to improve.    Rutherford Guys, MD [facility] [place], [place] [address] Ph.  [phone] Fax [phone]

## 2019-06-14 ENCOUNTER — Telehealth: Payer: Self-pay | Admitting: General Practice

## 2019-06-14 NOTE — Telephone Encounter (Signed)
Patient called in stating she is needing an alternative to diclofenac sodium (VOLTAREN) 1 % GEL As insurance is not covering that medication. Please advise.

## 2019-06-15 DIAGNOSIS — M9903 Segmental and somatic dysfunction of lumbar region: Secondary | ICD-10-CM | POA: Diagnosis not present

## 2019-06-15 DIAGNOSIS — M5431 Sciatica, right side: Secondary | ICD-10-CM | POA: Diagnosis not present

## 2019-06-15 NOTE — Telephone Encounter (Signed)
Please advise, is there an alternative you would like me to send to her pharmacy?

## 2019-06-16 NOTE — Telephone Encounter (Signed)
Please let her know that it is available over the counter. Also she can use [facility]. thanks

## 2019-06-17 ENCOUNTER — Ambulatory Visit
Admission: RE | Admit: 2019-06-17 | Discharge: 2019-06-17 | Disposition: A | Discharge status: Home / Self Care | Payer: BC Managed Care – PPO | Source: Ambulatory Visit | Attending: Physician Assistant | Admitting: Physician Assistant

## 2019-06-17 ENCOUNTER — Other Ambulatory Visit: Payer: Self-pay

## 2019-06-17 DIAGNOSIS — Z1231 Encounter for screening mammogram for malignant neoplasm of breast: Secondary | ICD-10-CM | POA: Diagnosis not present

## 2019-06-21 ENCOUNTER — Other Ambulatory Visit: Payer: Self-pay | Admitting: Family Medicine

## 2019-06-21 ENCOUNTER — Encounter: Payer: Self-pay | Admitting: *Deleted

## 2019-06-21 ENCOUNTER — Other Ambulatory Visit: Payer: Self-pay | Admitting: Physician Assistant

## 2019-06-21 ENCOUNTER — Telehealth: Payer: Self-pay | Admitting: Family Medicine

## 2019-06-21 DIAGNOSIS — R928 Other abnormal and inconclusive findings on diagnostic imaging of breast: Secondary | ICD-10-CM

## 2019-06-21 NOTE — Telephone Encounter (Signed)
Per patient [facility] needs another order as original results show area of concern. Please contact patient.

## 2019-06-21 NOTE — Telephone Encounter (Signed)
Message sent my chart  

## 2019-06-21 NOTE — Telephone Encounter (Signed)
Order has been placed by provider.

## 2019-06-24 ENCOUNTER — Telehealth: Payer: Self-pay

## 2019-06-24 ENCOUNTER — Ambulatory Visit
Admission: RE | Admit: 2019-06-24 | Discharge: 2019-06-24 | Disposition: A | Discharge status: Home / Self Care | Payer: BC Managed Care – PPO | Source: Ambulatory Visit | Attending: Family Medicine | Admitting: Family Medicine

## 2019-06-24 ENCOUNTER — Other Ambulatory Visit: Payer: Self-pay

## 2019-06-24 DIAGNOSIS — R922 Inconclusive mammogram: Secondary | ICD-10-CM | POA: Diagnosis not present

## 2019-06-24 DIAGNOSIS — R928 Other abnormal and inconclusive findings on diagnostic imaging of breast: Secondary | ICD-10-CM

## 2019-06-24 NOTE — Telephone Encounter (Signed)
[facility] on 06[facility]23/2019.

## 2019-06-29 DIAGNOSIS — M9903 Segmental and somatic dysfunction of lumbar region: Secondary | ICD-10-CM | POA: Diagnosis not present

## 2019-06-29 DIAGNOSIS — M5431 Sciatica, right side: Secondary | ICD-10-CM | POA: Diagnosis not present

## 2019-11-29 ENCOUNTER — Encounter: Payer: Self-pay | Admitting: Family Medicine

## 2019-12-05 ENCOUNTER — Other Ambulatory Visit: Payer: Self-pay

## 2019-12-05 ENCOUNTER — Ambulatory Visit: Payer: BC Managed Care – PPO | Admitting: Family Medicine

## 2019-12-05 ENCOUNTER — Encounter: Payer: Self-pay | Admitting: Family Medicine

## 2019-12-05 VITALS — BP 152/86 | HR 77 | Temp 98.6°F | Ht 62.0 in | Wt 136.0 lb

## 2019-12-05 DIAGNOSIS — R030 Elevated blood-pressure reading, without diagnosis of hypertension: Secondary | ICD-10-CM | POA: Diagnosis not present

## 2019-12-05 DIAGNOSIS — N950 Postmenopausal bleeding: Secondary | ICD-10-CM | POA: Diagnosis not present

## 2019-12-05 DIAGNOSIS — F410 Panic disorder [episodic paroxysmal anxiety] without agoraphobia: Secondary | ICD-10-CM | POA: Diagnosis not present

## 2019-12-05 DIAGNOSIS — R7303 Prediabetes: Secondary | ICD-10-CM

## 2019-12-05 DIAGNOSIS — N952 Postmenopausal atrophic vaginitis: Secondary | ICD-10-CM | POA: Diagnosis not present

## 2019-12-05 MED ORDER — ESTRADIOL 10 MCG VA TABS: 1.0000 | ORAL_TABLET | VAGINAL | 2 refills | Status: DC

## 2019-12-05 MED ORDER — DIAZEPAM 2 MG PO TABS: 2.0000 mg | ORAL_TABLET | Freq: Two times a day (BID) | ORAL | 0 refills | Status: DC | PRN

## 2019-12-05 NOTE — Progress Notes (Signed)
3/29/20212:52 PM  Melanie Montgomery [facility] Feb 10, 1961, 59 y.o., female [id number]  Chief Complaint  Patient presents with  . pink spotting    after wiping- twice in 2 weeks w/ left ovary pain   . Medication Refill    valium    HPI:   Patient is a 59 y.o. female with past medical history significant for prediabetes, vitamin D deficiency who presents today with several concerns  2 weeks ago she had 2 episodes of very light pink spotting, couple days apart associated with LLQ pain Spotting only noticed when wiping She denies any vaginal pain or discomfort She does have vaginal dryness She did have a hemorrhoid at that time Menopause since 2010 Last pap July 2018 - neg cotesting  Has not been checking BP recently Has noticed elevated readings most recent drs visit She has gained weight  She is requesting refill of valium Takes very prn for panic attacks since the death of several family members several years ago Her last prescription lasted 2 years pmp reviewed  Depression screen [facility] 2/9 12/05/2019 06/09/2019 05/25/2018  Decreased Interest 0 0 0  Down, Depressed, Hopeless 0 0 0  PHQ - 2 Score 0 0 0  Altered sleeping - - -  Tired, decreased energy - - -  Change in appetite - - -  Feeling bad or failure about yourself  - - -  Trouble concentrating - - -  Moving slowly or fidgety/restless - - -  Suicidal thoughts - - -  PHQ-9 Score - - -  Difficult doing work/chores - - -    Fall Risk  12/05/2019 06/09/2019 05/25/2018 07/27/2017 04/06/2017  Falls in the past year? 0 0 Yes No No  Number falls in past yr: 0 0 - - -  Injury with Fall? 0 0 - - -  Follow up Falls evaluation completed - - - -     No Known Allergies  Prior to Admission medications   Medication Sig Start Date End Date Taking? Authorizing Provider  cyclobenzaprine (FLEXERIL) 10 MG tablet Take 1 tablet (10 mg total) by mouth 3 (three) times daily as needed for muscle spasms. 06/09/19  Yes Rutherford Guys, MD    diazepam (VALIUM) 5 MG tablet Take 5 mg by mouth every 6 (six) hours as needed for anxiety.   Yes [provider]  diclofenac sodium (VOLTAREN) 1 % GEL Apply 4 g topically 4 (four) times daily. 06/09/19  Yes Rutherford Guys, MD  Multiple Vitamin (MULTIVITAMIN) tablet Take 1 tablet by mouth daily.   Yes [provider]    Past Medical History:  Diagnosis Date  . Anemia   . Anxiety   . Blood transfusion without reported diagnosis   . Cataract   . Depression     Past Surgical History:  Procedure Laterality Date  . CESAREAN SECTION    . FRACTURE SURGERY    . SPLENECTOMY, TOTAL  1987  . TUBAL LIGATION      Social History   Tobacco Use  . Smoking status: Former Smoker    Packs/day: 2.00    Types: Cigarettes    Start date: 08/21/1981    Quit date: 01/02/2013    Years since quitting: 6.9  . Smokeless tobacco: Never Used  Substance Use Topics  . Alcohol use: No    Family History  Problem Relation Age of Onset  . Cancer Mother   . Diabetes Mother   . Breast cancer Mother 80  . Heart disease Father   .  Diabetes Sister   . Colon cancer Sister   . Diabetes Brother   . Heart disease Brother   . Heart attack Brother   . Heart attack Brother 93  . Diabetes Sister   . Heart failure Sister   . Diabetes Sister   . Breast cancer Maternal Grandmother   . Esophageal cancer Maternal Uncle   . Rectal cancer Neg Hx   . Stomach cancer Neg Hx     Review of Systems  Constitutional: Negative for chills and fever.  Respiratory: Negative for cough and shortness of breath.   Cardiovascular: Negative for chest pain, palpitations and leg swelling.  Gastrointestinal: Negative for abdominal pain, nausea and vomiting.   Per hpi  OBJECTIVE:  Today's Vitals   12/05/19 1436 12/05/19 1439 12/05/19 1511  BP: (!) 152/74 (!) 148/84 (!) 152/86  Pulse: 77    Temp: 98.6 F (37 C)    SpO2: 97%    Weight: 136 lb (61.7 kg)    Height: _0  (1.575 m)     Body mass index  is 24.87 kg/m.    Physical Exam Vitals and nursing note reviewed. Exam conducted with a chaperone present.  Constitutional:      Appearance: She is well-developed.  HENT:     Head: Normocephalic and atraumatic.     Mouth/Throat:     Pharynx: No oropharyngeal exudate.  Eyes:     General: No scleral icterus.    Conjunctiva/sclera: Conjunctivae normal.     Pupils: Pupils are equal, round, and reactive to light.  Cardiovascular:     Rate and Rhythm: Normal rate and regular rhythm.     Heart sounds: Normal heart sounds. No murmur. No friction rub. No gallop.   Pulmonary:     Effort: Pulmonary effort is normal.     Breath sounds: Normal breath sounds. No wheezing or rales.  Genitourinary:    Labia:        Right: Lesion (small superficial abrasion, not actively bleeding) present.      Vagina: No erythema, bleeding or lesions.     Cervix: No cervical motion tenderness, discharge, friability, lesion or cervical bleeding.     Uterus: Not fixed and not tender.      Adnexa:        Right: No mass or tenderness.         Left: No mass or tenderness.       Comments: Atrophic tissue Musculoskeletal:     Cervical back: Neck supple.  Skin:    General: Skin is warm and dry.  Neurological:     Mental Status: She is alert and oriented to person, place, and time.     No results found for this or any previous visit (from the past 24 hour(s)).  No results found.   ASSESSMENT and PLAN  1. Post-menopausal atrophic vaginitis Discussed treatment options, trial of low dose vag estrogen, reviewed r/se/b  2. Postmenopausal bleeding Most likely from abrasion 2/2 atrophic vaginitis. [place] to r/o endometrial source of bleeding. TSH pending.  - TSH - US Pelvic Complete With Transvaginal; Future  3. Panic attacks pmp reviewed. Valium prn. Reviewed r/se/b  4. Elevated blood-pressure reading without diagnosis of hypertension Patient declines meds at this time in favor of trial of LFM. RTC  precautions given  5. Prediabetes Checking labs today, medications will be started as needed.  - Lipid panel - CMP14+EGFR - Hemoglobin A1c  Other orders - Estradiol 10 MCG TABS vaginal tablet; Place 1 tablet (10 mcg  total) vaginally 2 (two) times a week. - diazepam (VALIUM) 2 MG tablet; Take 1 tablet (2 mg total) by mouth every 12 (twelve) hours as needed for anxiety.  Return in about 3 months (around 03/06/2020).    Rutherford Guys, MD Primary Care at  [facility]  [address] [place], [place] [address] Ph.  [phone] Fax  [phone]

## 2019-12-05 NOTE — Patient Instructions (Signed)
° ° ° °  If you have lab work done today you will be contacted with your lab results within the next 2 weeks.  If you have not heard from us then please contact us. The fastest way to get your results is to register for My Chart. ° ° °IF you received an x-ray today, you will receive an invoice from [facility]. Please contact [facility] at [phone] with questions or concerns regarding your invoice.  ° °IF you received labwork today, you will receive an invoice from [facility]. Please contact [facility] at [phone] with questions or concerns regarding your invoice.  ° °Our billing staff will not be able to assist you with questions regarding bills from these companies. ° °You will be contacted with the lab results as soon as they are available. The fastest way to get your results is to activate your My Chart account. Instructions are located on the last page of this paperwork. If you have not heard from us regarding the results in 2 weeks, please contact this office. °  ° ° ° °

## 2019-12-06 ENCOUNTER — Encounter: Payer: Self-pay | Admitting: Family Medicine

## 2019-12-06 DIAGNOSIS — N950 Postmenopausal bleeding: Secondary | ICD-10-CM

## 2019-12-06 LAB — LIPID PANEL
Chol/HDL Ratio: 3 ratio (ref 0.0–4.4)
Cholesterol, Total: 173 mg/dL (ref 100–199)
HDL: 58 mg/dL (ref >39)
LDL Chol Calc (NIH): 93 mg/dL (ref 0–99)
Triglycerides: 125 mg/dL (ref 0–149)
VLDL Cholesterol Cal: 22 mg/dL (ref 5–40)

## 2019-12-06 LAB — CMP14+EGFR
ALT: 15 IU/L (ref 0–32)
AST: 14 IU/L (ref 0–40)
Albumin/Globulin Ratio: 1.8 (ref 1.2–2.2)
Albumin: 4.7 g/dL (ref 3.8–4.9)
Alkaline Phosphatase: 136 IU/L — ABNORMAL HIGH (ref 39–117)
BUN/Creatinine Ratio: 31 — ABNORMAL HIGH (ref 9–23)
BUN: 16 mg/dL (ref 6–24)
Bilirubin Total: 0.2 mg/dL (ref 0.0–1.2)
CO2: 26 mmol/L (ref 20–29)
Calcium: 10.8 mg/dL — ABNORMAL HIGH (ref 8.7–10.2)
Chloride: 102 mmol/L (ref 96–106)
Creatinine, Ser: 0.51 mg/dL — ABNORMAL LOW (ref 0.57–1.00)
GFR calc Af Amer: 123 mL/min/{1.73_m2} (ref >59)
GFR calc non Af Amer: 106 mL/min/{1.73_m2} (ref >59)
Globulin, Total: 2.6 g/dL (ref 1.5–4.5)
Glucose: 89 mg/dL (ref 65–99)
Potassium: 4.1 mmol/L (ref 3.5–5.2)
Sodium: 140 mmol/L (ref 134–144)
Total Protein: 7.3 g/dL (ref 6.0–8.5)

## 2019-12-06 LAB — HEMOGLOBIN A1C
Est. average glucose Bld gHb Est-mCnc: 114 mg/dL
Hgb A1c MFr Bld: 5.6 % (ref 4.8–5.6)

## 2019-12-06 LAB — TSH: TSH: 1.71 u[IU]/mL (ref 0.450–4.500)

## 2019-12-06 NOTE — Telephone Encounter (Signed)
Patient states that the wrong prescription was sent to the pharmacy and she can't afford the one that was there and  need Estradiol. Patient also have questions and concerns about labs. Please Advise

## 2019-12-07 ENCOUNTER — Ambulatory Visit
Admission: RE | Admit: 2019-12-07 | Discharge: 2019-12-07 | Disposition: A | Discharge status: Home / Self Care | Payer: 59 | Source: Ambulatory Visit | Attending: Family Medicine | Admitting: Family Medicine

## 2019-12-07 ENCOUNTER — Telehealth: Payer: Self-pay | Admitting: Family Medicine

## 2019-12-07 DIAGNOSIS — N950 Postmenopausal bleeding: Secondary | ICD-10-CM

## 2019-12-07 DIAGNOSIS — N83299 Other ovarian cyst, unspecified side: Secondary | ICD-10-CM

## 2019-12-07 NOTE — Telephone Encounter (Signed)
[facility] called and has her ultrasound results in they are wanting provider or nurse to give them a call to receive thoes results. [phone] Please advise.

## 2019-12-07 NOTE — Telephone Encounter (Signed)
Second endometrial complex, endometrial sampling indicated, complex lesion right ovary, MRI recommended

## 2019-12-08 MED ORDER — ESTRADIOL 0.1 MG/GM VA CREA: 0.5000 | TOPICAL_CREAM | VAGINAL | 2 refills | Status: DC

## 2019-12-08 NOTE — Telephone Encounter (Signed)
Please let patient know that I have ordered her pelvic MRI as recommended by radiology to further evaluate her complex right ovarian cyst. thanks

## 2019-12-21 ENCOUNTER — Ambulatory Visit: Payer: 59 | Attending: Internal Medicine

## 2019-12-21 DIAGNOSIS — Z23 Encounter for immunization: Secondary | ICD-10-CM

## 2019-12-21 NOTE — Progress Notes (Signed)
   Covid-19 Vaccination Clinic  Name:  Indica Sokolski    MRN: [id number] DOB: 25-Nov-1960  12/21/2019  Ms. Corson was observed post Covid-19 immunization for 15 minutes without incident. She was provided with Vaccine Information Sheet and instruction to access the V-Safe system.   Ms. Stiltner was instructed to call 911 with any severe reactions post vaccine: Marland Kitchen Difficulty breathing  . Swelling of face and throat  . A fast heartbeat  . A bad rash all over body  . Dizziness and weakness   Immunizations Administered    Name Date Dose VIS Date Route   Pfizer COVID-19 Vaccine 12/21/2019  4:56 PM 0.3 mL 08/19/2019 Intramuscular   Manufacturer: [place]   Lot: [id number]   [place]: [id number]

## 2020-01-02 ENCOUNTER — Other Ambulatory Visit: Payer: Self-pay | Admitting: Family Medicine

## 2020-01-06 ENCOUNTER — Ambulatory Visit
Admission: RE | Admit: 2020-01-06 | Discharge: 2020-01-06 | Disposition: A | Discharge status: Home / Self Care | Payer: 59 | Source: Ambulatory Visit | Attending: Family Medicine | Admitting: Family Medicine

## 2020-01-06 ENCOUNTER — Other Ambulatory Visit: Payer: Self-pay

## 2020-01-06 DIAGNOSIS — N83299 Other ovarian cyst, unspecified side: Secondary | ICD-10-CM

## 2020-01-06 MED ORDER — GADOBENATE DIMEGLUMINE 529 MG/ML IV SOLN
13.0000 mL | Freq: Once | INTRAVENOUS | Status: AC | PRN
  Administered 2020-01-06: 13 mL via INTRAVENOUS

## 2020-01-09 ENCOUNTER — Encounter: Payer: Self-pay | Admitting: Family Medicine

## 2020-01-25 ENCOUNTER — Other Ambulatory Visit (HOSPITAL_COMMUNITY)
Admission: RE | Admit: 2020-01-25 | Discharge: 2020-01-25 | Disposition: A | Discharge status: Home / Self Care | Payer: 59 | Source: Ambulatory Visit | Attending: Obstetrics & Gynecology | Admitting: Obstetrics & Gynecology

## 2020-01-25 ENCOUNTER — Other Ambulatory Visit: Payer: Self-pay

## 2020-01-25 ENCOUNTER — Encounter: Payer: Self-pay | Admitting: Obstetrics & Gynecology

## 2020-01-25 ENCOUNTER — Ambulatory Visit (INDEPENDENT_AMBULATORY_CARE_PROVIDER_SITE_OTHER): Payer: 59 | Admitting: Obstetrics & Gynecology

## 2020-01-25 VITALS — BP 143/83 | HR 75 | Wt 140.0 lb

## 2020-01-25 DIAGNOSIS — N83201 Unspecified ovarian cyst, right side: Secondary | ICD-10-CM

## 2020-01-25 DIAGNOSIS — N950 Postmenopausal bleeding: Secondary | ICD-10-CM

## 2020-01-25 DIAGNOSIS — N939 Abnormal uterine and vaginal bleeding, unspecified: Secondary | ICD-10-CM | POA: Diagnosis not present

## 2020-01-25 LAB — POCT URINALYSIS DIP (DEVICE)
Bilirubin Urine: NEGATIVE
Glucose, UA: NEGATIVE mg/dL
Ketones, ur: NEGATIVE mg/dL
Leukocytes,Ua: NEGATIVE
Nitrite: NEGATIVE
Protein, ur: NEGATIVE mg/dL
Specific Gravity, Urine: 1.015 (ref 1.005–1.030)
Urobilinogen, UA: 0.2 mg/dL (ref 0.0–1.0)
pH: 7 (ref 5.0–8.0)

## 2020-01-25 NOTE — Progress Notes (Signed)
lPatient stated she had a Mri and U/S found something on right ovary. Only spotting a few times light pink. Slight cramps on right side.

## 2020-01-25 NOTE — Progress Notes (Signed)
Patient ID: Melanie Montgomery, female   DOB: 16-Nov-1960, 59 y.o.   MRN: [id number]  Chief Complaint  Patient presents with  . Vaginal Bleeding  right ovarian cyst  HPI Melanie Montgomery is a 59 y.o. female.  No obstetric history on file. No LMP recorded. Patient is postmenopausal.ten years ago. She had an episode of pink staining on tissue after wiping one day 2-3 months ago. She has had hematuria on testing consistently and has had kidney stones. US showed 2-3 cm cystic mass on right ovary with thin septations. EM was 6 mm  HPI  Past Medical History:  Diagnosis Date  . Anemia   . Anxiety   . Blood transfusion without reported diagnosis   . Cataract   . Depression     Past Surgical History:  Procedure Laterality Date  . CESAREAN SECTION    . FRACTURE SURGERY    . SPLENECTOMY, TOTAL  1987  . TUBAL LIGATION      Family History  Problem Relation Age of Onset  . Cancer Mother   . Diabetes Mother   . Breast cancer Mother 66  . Heart disease Father   . Diabetes Sister   . Colon cancer Sister   . Diabetes Brother   . Heart disease Brother   . Heart attack Brother   . Heart attack Brother 48  . Diabetes Sister   . Heart failure Sister   . Diabetes Sister   . Breast cancer Maternal Grandmother   . Esophageal cancer Maternal Uncle   . Rectal cancer Neg Hx   . Stomach cancer Neg Hx     Social History Social History   Tobacco Use  . Smoking status: Former Smoker    Packs/day: 2.00    Types: Cigarettes    Start date: 08/21/1981    Quit date: 01/02/2013    Years since quitting: 7.0  . Smokeless tobacco: Never Used  Substance Use Topics  . Alcohol use: No  . Drug use: No    No Known Allergies  Current Outpatient Medications  Medication Sig Dispense Refill  . cyclobenzaprine (FLEXERIL) 10 MG tablet Take 1 tablet (10 mg total) by mouth 3 (three) times daily as needed for muscle spasms. 30 tablet 0  . diazepam (VALIUM) 2 MG tablet Take 1 tablet (2 mg total)  by mouth every 12 (twelve) hours as needed for anxiety. 15 tablet 0  . diclofenac sodium (VOLTAREN) 1 % GEL Apply 4 g topically 4 (four) times daily. 100 g 0  . estradiol (ESTRACE) 0.1 MG/GM vaginal cream Place 0.5 Applicatorfuls vaginally 3 (three) times a week. Apply at bedtime 42.5 g 2  . Multiple Vitamin (MULTIVITAMIN) tablet Take 1 tablet by mouth daily.     No current facility-administered medications for this visit.    Review of Systems Review of Systems  Constitutional: Negative.   Gastrointestinal: Positive for abdominal pain (occasional right side cramp).  Genitourinary: Positive for hematuria (history of microscopic hematuria) and vaginal bleeding. Negative for vaginal discharge.    Blood pressure (!) 143/83, pulse 75, weight 140 lb (63.5 kg).  Physical Exam Physical Exam Constitutional:      Appearance: Normal appearance.  Pulmonary:     Effort: Pulmonary effort is normal.  Abdominal:     General: Abdomen is flat.     Palpations: There is no mass.     Tenderness: There is no abdominal tenderness.     Comments: Midline vertical surgical scar  Genitourinary:    Comments: Genital  atrophy postmenopause Pelvic exam: normal external genitalia, vulva, vagina, cervix, uterus and adnexa, CERVIX: normal appearing cervix without discharge or lesions., no masses or tenderness, white secretions/discharge noted  Neurological:     Mental Status: She is alert.     Data Reviewed CLINICAL DATA:  Postmenopausal bleeding, intermittent spotting in past month, LEFT lower quadrant pain  EXAM: TRANSABDOMINAL AND TRANSVAGINAL ULTRASOUND OF PELVIS  TECHNIQUE: Both transabdominal and transvaginal ultrasound examinations of the pelvis were performed. Transabdominal technique was performed for global imaging of the pelvis including uterus, ovaries, adnexal regions, and pelvic cul-de-sac. It was necessary to proceed with endovaginal exam following the transabdominal exam to visualize  the endometrium .  COMPARISON:  06/15/2009  FINDINGS: Uterus  Measurements: 9.3 x 2.0 x 5.4 cm = volume: 51 mL. Anteverted. Normal morphology without mass  Endometrium  Thickness: 6 mm.  No endometrial fluid or focal abnormality  Right ovary  Measurements: 3.5 x 2.8 x 3.5 cm = volume: 17.8 mL. Complicated cyst of the RIGHT ovary 2.1 x 2.7 x 2.2 cm containing thin septations. No definite mural nodularity. No blood flow within the septation on color Doppler imaging.  Left ovary  Measurements: 2.5 x 1.7 x 3.0 cm = volume: 6.9 mL. Seen only on transabdominal imaging. Normal morphology without mass  Other findings  No free pelvic fluid.  No other adnexal masses.  IMPRESSION: 6 mm thick endometrial complex, abnormal for a postmenopausal patient with bleeding; in the setting of post-menopausal bleeding, endometrial sampling is indicated to exclude carcinoma. If results are benign, sonohysterogram should be considered for focal lesion work-up. (Ref: Radiological Reasoning: Algorithmic Workup of Abnormal Vaginal Bleeding with Endovaginal Sonography and Sonohysterography. AJR [id number])  Complex cystic lesion of the RIGHT ovary 2.7 cm greatest size containing thin septations, new since 2010; lesion is indeterminate and further evaluation by MR imaging with and without contrast is recommended.  These results will be called to the ordering clinician or representative by the Radiologist Assistant, and communication documented in the PACS or [employer].   Electronically Signed   By: Lavonia Dana M.D.   On: 12/07/2019 16:23 CLINICAL DATA:  Evaluate complex right ovarian cyst identified by prior ultrasound, postmenopausal bleeding  EXAM: MRI PELVIS WITHOUT AND WITH CONTRAST  TECHNIQUE: Multiplanar multisequence MR imaging of the pelvis was performed both before and after administration of intravenous contrast.  CONTRAST:  89mL MULTIHANCE  GADOBENATE DIMEGLUMINE 529 MG/ML IV SOLN  COMPARISON:  None.  FINDINGS: Examination is significantly limited by extensive breath motion artifact throughout, particularly on contrast enhanced sequences.  Urinary Tract:  No abnormality visualized.  Bowel:  Unremarkable visualized pelvic bowel loops.  Vascular/Lymphatic: No pathologically enlarged lymph nodes. No significant vascular abnormality seen.  Reproductive: A multicystic lesion of the right ovary is again seen, measuring approximately 3.0 x 2.0 x 2.9 cm. There are generally thin appearing internal septations without definite nodularity. Assessment for internal or mural nodular contrast enhancement is essentially nondiagnostic due to breath motion artifact on provided contrast enhanced sequences. The uterus is normal in appearance.  Other:  None.  Musculoskeletal: No suspicious bone lesions identified.  IMPRESSION: 1. Examination is significantly limited by extensive breath motion artifact throughout, particularly on contrast enhanced sequences.A multicystic lesion of the right ovary is again seen, measuring approximately 3.0 x 2.0 x 2.9 cm. There are generally thin appearing internal septations without definite nodularity. Assessment for internal or mural nodular contrast enhancement is essentially nondiagnostic due to breath motion artifact on provided contrast enhanced sequences. Due  to limited assessment to exclude malignancy, recommend at minimum follow-up ultrasound in 2-3 months to ensure ongoing stability. There is no other evidence of mass and no evidence of lymphadenopathy or metastatic disease in the pelvis.  2. The uterus is normal in appearance by MR, without findings to explain abnormal postmenopausal uterine bleeding.   Electronically Signed   By: Eddie Candle M.D.   On: 01/06/2020 15:57  Assessment Right ovarian cystic mass thin septation less than 5 mm, moderately elevated risk, needs  f/u.  Microscopic hematuria doubt uterine bleeding. [place] 6 mm stripe Plan F/u US, CA 125. Reassured that she is low risk for endometrial neoplasia in this scenario    Melanie Montgomery 01/25/2020, 4:41 PM

## 2020-01-25 NOTE — Patient Instructions (Signed)
Ovarian Cyst An ovarian cyst is a fluid-filled sac on an ovary. The ovaries are organs that make eggs in women. Most ovarian cysts go away on their own and are not cancerous (are benign). Some cysts need treatment. Follow these instructions at home:  Take over-the-counter and prescription medicines only as told by your doctor.  Do not drive or use heavy machinery while taking prescription pain medicine.  Get pelvic exams and Pap tests as often as told by your doctor.  Return to your normal activities as told by your doctor. Ask your doctor what activities are safe for you.  Do not use any products that contain nicotine or tobacco, such as cigarettes and e-cigarettes. If you need help quitting, ask your doctor.  Keep all follow-up visits as told by your doctor. This is important. Contact a doctor if:  Your periods are: ? Late. ? Irregular. ? Painful.   Your periods stop.  You have pelvic pain that does not go away.  You have pressure on your bladder.  You have trouble making your bladder empty when you pee (urinate).  You have pain during sex.  You have any of the following in your belly (abdomen): ? A feeling of fullness. ? Pressure. ? Discomfort. ? Pain that does not go away. ? Swelling.  You feel sick most of the time.  You have trouble pooping (have constipation).  You are not as hungry as usual (you lose your appetite).  You get very bad acne.  You start to have more hair on your body and face.  You are gaining weight or losing weight without changing your exercise and eating habits.  You think you may be pregnant. Get help right away if:  You have belly pain that is very bad or gets worse.  You cannot eat or drink without throwing up (vomiting).  You suddenly get a fever.  Your period is a lot heavier than usual. This information is not intended to replace advice given to you by your health care provider. Make sure you discuss any questions you have  with your health care provider. Document Revised: 08/07/2017 Document Reviewed: 01/27/2016 Elsevier Patient Education  2020 [employer].

## 2020-01-26 ENCOUNTER — Other Ambulatory Visit: Payer: Self-pay | Admitting: Obstetrics & Gynecology

## 2020-01-26 DIAGNOSIS — N939 Abnormal uterine and vaginal bleeding, unspecified: Secondary | ICD-10-CM

## 2020-01-26 LAB — CERVICOVAGINAL ANCILLARY ONLY
Bacterial Vaginitis (gardnerella): POSITIVE — AB
Candida Glabrata: NEGATIVE
Candida Vaginitis: NEGATIVE
Chlamydia: NEGATIVE
Comment: NEGATIVE
Comment: NEGATIVE
Comment: NEGATIVE
Comment: NEGATIVE
Comment: NEGATIVE
Comment: NORMAL
Neisseria Gonorrhea: NEGATIVE
Trichomonas: NEGATIVE

## 2020-01-26 LAB — CA 125: Cancer Antigen (CA) 125: 10.2 U/mL (ref 0.0–38.1)

## 2020-01-26 MED ORDER — METRONIDAZOLE 500 MG PO TABS: 500.0000 mg | ORAL_TABLET | Freq: Two times a day (BID) | ORAL | 0 refills | Status: AC

## 2020-01-26 NOTE — Progress Notes (Signed)
Meds ordered this encounter  Medications  . metroNIDAZOLE (FLAGYL) 500 MG tablet    Sig: Take 1 tablet (500 mg total) by mouth 2 (two) times daily for 7 days.    Dispense:  14 tablet    Refill:  0

## 2020-03-05 ENCOUNTER — Ambulatory Visit: Payer: 59 | Admitting: Family Medicine

## 2020-03-05 ENCOUNTER — Other Ambulatory Visit: Payer: Self-pay

## 2020-03-05 ENCOUNTER — Encounter: Payer: Self-pay | Admitting: Family Medicine

## 2020-03-05 VITALS — BP 138/68 | HR 80 | Temp 98.2°F | Resp 15 | Ht 62.0 in | Wt 137.8 lb

## 2020-03-05 DIAGNOSIS — L02224 Furuncle of groin: Secondary | ICD-10-CM | POA: Diagnosis not present

## 2020-03-05 DIAGNOSIS — N952 Postmenopausal atrophic vaginitis: Secondary | ICD-10-CM | POA: Diagnosis not present

## 2020-03-05 MED ORDER — DOXYCYCLINE HYCLATE 100 MG PO TABS: 100.0000 mg | ORAL_TABLET | Freq: Two times a day (BID) | ORAL | 0 refills | Status: DC

## 2020-03-05 MED ORDER — ESTRADIOL 0.1 MG/GM VA CREA: 0.5000 | TOPICAL_CREAM | VAGINAL | 2 refills | Status: DC

## 2020-03-05 NOTE — Patient Instructions (Addendum)
If you have lab work done today you will be contacted with your lab results within the next 2 weeks.  If you have not heard from [place] then please contact us. The fastest way to get your results is to register for My Chart.   IF you received an x-ray today, you will receive an invoice from [facility]. Please contact [facility] at [phone] with questions or concerns regarding your invoice.   IF you received labwork today, you will receive an invoice from [place]. Please contact [facility] at [phone] with questions or concerns regarding your invoice.   Our billing staff will not be able to assist you with questions regarding bills from these companies.  You will be contacted with the lab results as soon as they are available. The fastest way to get your results is to activate your My Chart account. Instructions are located on the last page of this paperwork. If you have not heard from [place] regarding the results in 2 weeks, please contact this office.     Absceso en la piel Skin Abscess  Un absceso en la piel es una zona infectada de la piel que contiene pus y Psychiatric nurse. Un absceso puede presentarse en cualquier parte del cuerpo. Algunos abscesos se abren (rompen) por s solos. La mayora sigue empeorando si no se Secretary/administrator. La infeccin puede diseminarse hacia otras partes del cuerpo y Sports administrator a la sangre, lo que puede hacer que usted se sienta mal. Los abscesos en la piel son causados por grmenes que ingresan en la piel a travs de un corte o un rasguo. Tambin pueden ser causados por una obstruccin en las glndulas sebceas y 27 o una infeccin en los folculos capilares. Por lo general, el tratamiento de esta afeccin incluye lo siguiente:  Drenado del pus.  Toma de antibiticos.  Aplicacin de un pao hmedo y tibio sobre el absceso. Siga estas indicaciones en su casa: Medicamentos   [employer] de venta libre y los  recetados solamente como se lo haya indicado el mdico.  Si le recetaron un antibitico, tmelo como se lo haya indicado el mdico. No deje de tomar el antibitico aunque comience a sentirse mejor. Cuidado del absceso   Si usted tiene un absceso que no ha drenado, coloque un pao limpio, hmedo y tibio sobre el absceso varias veces al da. Hgalo como se lo haya indicado el mdico.  Siga las indicaciones el mdico en lo que respecta al cuidado del absceso. Asegrese de hacer lo siguiente: ? Reunion el absceso con una venda (vendaje). ? Cambie la venda o gasa como se lo haya indicado el mdico. ? Lvese las manos con agua y [place] antes de cambiar la venda o gasa. Use un desinfectante para manos si no dispone de [place] y [place].  Controle el [employer] para observar si hay signos de que la infeccin [place]. Est atento a los siguientes signos: ? Aumento del enrojecimiento, la hinchazn o Conservation officer, historic buildings. ? Ms lquido Delorise Shiner. ? Calor. ? Mal olor o ms pus. Indicaciones generales  Para evitar la propagacin de la infeccin: ? No comparta artculos de cuidado personal, toallas ni jacuzzis con [employer]. ? Evite el contacto piel con piel con [employer].  Concurra a todas las visitas de seguimiento como se lo haya indicado el mdico. Esto es importante. Comunquese con un mdico si:  Tiene ms enrojecimiento, hinchazn o dolor alrededor del absceso.  Aumenta la cantidad de  lquido o [employer] del absceso.  Siente el absceso caliente cuando lo toca.  Tiene ms pus o percibe que un mal olor proviene del absceso.  Tiene fiebre.  Le duelen los msculos.  Tiene escalofros.  Se siente mal. Solicite ayuda de inmediato si:  Siente un dolor que es muy intenso.  Observa lneas rojas en la piel que se extienden desde el absceso. Resumen  Un absceso en la piel es una zona infectada de la piel que contiene pus y Psychiatric nurse.  El absceso es causado por grmenes  que ingresan en la piel a travs de un corte o un rasguo. Tambin pueden ser causados por una obstruccin en las glndulas sebceas y 35 o una infeccin en los folculos capilares.  Siga las indicaciones del mdico en cuanto al cuidado del absceso, la toma de medicamentos, la prevencin de infecciones y las visitas de seguimiento. Esta informacin no tiene Marine scientist el consejo del mdico. Asegrese de hacerle al mdico cualquier pregunta que tenga. Document Revised: 12/07/2017 Document Reviewed: 12/07/2017 Elsevier Patient Education  [place].

## 2020-03-05 NOTE — Progress Notes (Signed)
6/28/20215:13 PM  Melanie Montgomery [facility] 01-Jun-1961, 59 y.o., female [id number]  Chief Complaint  Patient presents with  . Hypertension    pt has been taking BP at home recording at home BP, pt denies physical symptoms, 137/69 133/67 141/67 142/68  . Mass    pt has a small boil or cyst on the Lt side of her groin, noticed this saturday afternoon, painful, red, pt notes it is hard and about the size of a marble.     HPI:   Patient is a 59 y.o. female with past medical history significant for prediabetes, vitamin D deficiency who presents today for several concerns  Last OV march 2021 - started vaginal estrogen, was not able to start, needs new rx Saw obgyn may 2021 - monitor complex R ovarian mass, next [place] scheduled for sept 2021 She noticed a boil on her left groin for past 3 days, growing, sore, has been putting OTC medication for boils wo improvement  Depression screen [facility] 2/9 03/05/2020 01/25/2020 12/05/2019  Decreased Interest 0 0 1  Down, Depressed, Hopeless 1 1 1   PHQ - 2 Score 1 1 2   Altered sleeping - 2 2  Tired, decreased energy - 1 2  Change in appetite - 0 1  Feeling bad or failure about yourself  - 0 0  Trouble concentrating - 0 2  Moving slowly or fidgety/restless - 0 0  Suicidal thoughts - 0 0  PHQ-9 Score - 4 9  Difficult doing work/chores - - Not difficult at all    Fall Risk  03/05/2020 12/05/2019 06/09/2019 05/25/2018 07/27/2017  Falls in the past year? 0 0 0 Yes No  Number falls in past yr: - 0 0 - -  Injury with Fall? - 0 0 - -  Follow up Falls evaluation completed Falls evaluation completed - - -     No Known Allergies  Prior to Admission medications   Medication Sig Start Date End Date Taking? Authorizing Provider  cyclobenzaprine (FLEXERIL) 10 MG tablet Take 1 tablet (10 mg total) by mouth 3 (three) times daily as needed for muscle spasms. 06/09/19  Yes Rutherford Guys, MD  diazepam (VALIUM) 2 MG tablet Take 1 tablet (2 mg total) by mouth every 12  (twelve) hours as needed for anxiety. 12/05/19  Yes Rutherford Guys, MD  diclofenac sodium (VOLTAREN) 1 % GEL Apply 4 g topically 4 (four) times daily. 06/09/19  Yes Rutherford Guys, MD  estradiol (ESTRACE) 0.1 MG/GM vaginal cream Place 0.5 Applicatorfuls vaginally 3 (three) times a week. Apply at bedtime 12/09/19  Yes Rutherford Guys, MD  Multiple Vitamin (MULTIVITAMIN) tablet Take 1 tablet by mouth daily.   Yes [provider]    Past Medical History:  Diagnosis Date  . Anemia   . Anxiety   . Blood transfusion without reported diagnosis   . Cataract   . Depression     Past Surgical History:  Procedure Laterality Date  . CESAREAN SECTION    . FRACTURE SURGERY    . SPLENECTOMY, TOTAL  1987  . TUBAL LIGATION      Social History   Tobacco Use  . Smoking status: Former Smoker    Packs/day: 2.00    Types: Cigarettes    Start date: 08/21/1981    Quit date: 01/02/2013    Years since quitting: 7.1  . Smokeless tobacco: Never Used  Substance Use Topics  . Alcohol use: No    Family History  Problem Relation Age  of Onset  . Cancer Mother   . Diabetes Mother   . Breast cancer Mother 13  . Heart disease Father   . Diabetes Sister   . Colon cancer Sister   . Diabetes Brother   . Heart disease Brother   . Heart attack Brother   . Heart attack Brother 69  . Diabetes Sister   . Heart failure Sister   . Diabetes Sister   . Breast cancer Maternal Grandmother   . Esophageal cancer Maternal Uncle   . Rectal cancer Neg Hx   . Stomach cancer Neg Hx     ROS Per hpi Neg fever, chills, malaise, nausea or vomiting  OBJECTIVE:  Today's Vitals   03/05/20 1642 03/05/20 1644  BP: (!) 148/81 138/68  Pulse: 80   Resp: 15   Temp: 98.2 F (36.8 C)   TempSrc: Temporal   SpO2: 97%   Weight: 137 lb 12.8 oz (62.5 kg)   Height: 5\' 2"  (1.575 m)    Body mass index is 25.2 kg/m.   Wt Readings from Last 3 Encounters:  03/05/20 137 lb 12.8 oz (62.5 kg)  01/25/20 140 lb  (63.5 kg)  12/05/19 136 lb (61.7 kg)     Physical Exam Vitals and nursing note reviewed.  Constitutional:      Appearance: She is well-developed.  HENT:     Head: Normocephalic and atraumatic.     Mouth/Throat:     Pharynx: No oropharyngeal exudate.  Eyes:     General: No scleral icterus.    Conjunctiva/sclera: Conjunctivae normal.     Pupils: Pupils are equal, round, and reactive to light.  Cardiovascular:     Rate and Rhythm: Normal rate and regular rhythm.     Heart sounds: Normal heart sounds. No murmur heard.  No friction rub. No gallop.   Pulmonary:     Effort: Pulmonary effort is normal.     Breath sounds: Normal breath sounds. No wheezing or rales.  Musculoskeletal:     Cervical back: Neck supple.  Skin:    General: Skin is warm and dry.       Neurological:     Mental Status: She is alert and oriented to person, place, and time.     No results found for this or any previous visit (from the past 24 hour(s)).  No results found.   ASSESSMENT and PLAN  1. Post-menopausal atrophic vaginitis Start vaginal estrogen, reviewed r/se/b  2. Boil of inguinal region Not ready for I+D, warm compresses and abx, RTC precautions given  Other orders - estradiol (ESTRACE) 0.1 MG/GM vaginal cream; Place 0.5 Applicatorfuls vaginally 3 (three) times a week. Apply at bedtime - doxycycline (VIBRA-TABS) 100 MG tablet; Take 1 tablet (100 mg total) by mouth 2 (two) times daily.  Return if symptoms worsen or fail to improve.    Rutherford Guys, MD [facility] at [facility] [place], [place] [address] Ph.  [phone] Fax [phone]

## 2020-04-19 ENCOUNTER — Encounter: Payer: Self-pay | Admitting: Family Medicine

## 2020-04-19 ENCOUNTER — Other Ambulatory Visit: Payer: Self-pay | Admitting: Family Medicine

## 2020-04-19 MED ORDER — DIAZEPAM 2 MG PO TABS: 2.0000 mg | ORAL_TABLET | Freq: Two times a day (BID) | ORAL | 0 refills | Status: DC | PRN

## 2020-04-19 NOTE — Telephone Encounter (Signed)
pmp reviewd, appropriate meds refilled 

## 2020-04-19 NOTE — Telephone Encounter (Signed)
Patient is requesting a refill of the following medications: Requested Prescriptions   Pending Prescriptions Disp Refills   diazepam (VALIUM) 2 MG tablet 15 tablet 0    Sig: Take 1 tablet (2 mg total) by mouth every 12 (twelve) hours as needed for anxiety.    Date of patient request: 04/19/2020 Last office visit: 03/05/2020 Date of last refill: 12/05/2019 Last refill amount: 15 tab Follow up time period per chart: if symptoms don't improve

## 2020-05-10 ENCOUNTER — Encounter: Payer: Self-pay | Admitting: Family Medicine

## 2020-05-11 ENCOUNTER — Other Ambulatory Visit: Payer: Self-pay | Admitting: Family Medicine

## 2020-05-11 DIAGNOSIS — Z1231 Encounter for screening mammogram for malignant neoplasm of breast: Secondary | ICD-10-CM

## 2020-05-16 NOTE — Telephone Encounter (Signed)
Called patient and left a voicemail for her to return the call.  Also if she is feeling sick she should wait to get the flu vaccine at this time .

## 2020-05-17 ENCOUNTER — Other Ambulatory Visit: Payer: Self-pay

## 2020-05-17 ENCOUNTER — Telehealth: Payer: 59 | Admitting: Family Medicine

## 2020-05-21 ENCOUNTER — Ambulatory Visit
Admission: RE | Admit: 2020-05-21 | Discharge: 2020-05-21 | Disposition: A | Discharge status: Home / Self Care | Payer: 59 | Source: Ambulatory Visit | Attending: Obstetrics & Gynecology | Admitting: Obstetrics & Gynecology

## 2020-05-21 ENCOUNTER — Other Ambulatory Visit: Payer: Self-pay

## 2020-05-21 DIAGNOSIS — N83201 Unspecified ovarian cyst, right side: Secondary | ICD-10-CM | POA: Diagnosis present

## 2020-05-31 ENCOUNTER — Other Ambulatory Visit: Payer: Self-pay

## 2020-05-31 ENCOUNTER — Ambulatory Visit (INDEPENDENT_AMBULATORY_CARE_PROVIDER_SITE_OTHER): Payer: 59 | Admitting: Obstetrics & Gynecology

## 2020-05-31 ENCOUNTER — Encounter: Payer: Self-pay | Admitting: Obstetrics & Gynecology

## 2020-05-31 VITALS — BP 171/88 | HR 110 | Wt 143.9 lb

## 2020-05-31 DIAGNOSIS — N939 Abnormal uterine and vaginal bleeding, unspecified: Secondary | ICD-10-CM | POA: Diagnosis not present

## 2020-05-31 DIAGNOSIS — N83201 Unspecified ovarian cyst, right side: Secondary | ICD-10-CM

## 2020-05-31 NOTE — Patient Instructions (Signed)
Ovarian Cyst An ovarian cyst is a fluid-filled sac on an ovary. The ovaries are organs that make eggs in women. Most ovarian cysts go away on their own and are not cancerous (are benign). Some cysts need treatment. Follow these instructions at home:  Take over-the-counter and prescription medicines only as told by your doctor.  Do not drive or use heavy machinery while taking prescription pain medicine.  Get pelvic exams and Pap tests as often as told by your doctor.  Return to your normal activities as told by your doctor. Ask your doctor what activities are safe for you.  Do not use any products that contain nicotine or tobacco, such as cigarettes and e-cigarettes. If you need help quitting, ask your doctor.  Keep all follow-up visits as told by your doctor. This is important. Contact a doctor if:  Your periods are: ? Late. ? Irregular. ? Painful.   Your periods stop.  You have pelvic pain that does not go away.  You have pressure on your bladder.  You have trouble making your bladder empty when you pee (urinate).  You have pain during sex.  You have any of the following in your belly (abdomen): ? A feeling of fullness. ? Pressure. ? Discomfort. ? Pain that does not go away. ? Swelling.  You feel sick most of the time.  You have trouble pooping (have constipation).  You are not as hungry as usual (you lose your appetite).  You get very bad acne.  You start to have more hair on your body and face.  You are gaining weight or losing weight without changing your exercise and eating habits.  You think you may be pregnant. Get help right away if:  You have belly pain that is very bad or gets worse.  You cannot eat or drink without throwing up (vomiting).  You suddenly get a fever.  Your period is a lot heavier than usual. This information is not intended to replace advice given to you by your health care provider. Make sure you discuss any questions you have  with your health care provider. Document Revised: 08/07/2017 Document Reviewed: 01/27/2016 Elsevier Patient Education  2020 [employer].

## 2020-05-31 NOTE — Progress Notes (Signed)
Patient ID: Melanie Montgomery, female   DOB: [birth year], 59 y.o.   MRN: [id number]  Chief Complaint  Patient presents with  . Follow-up    HPI Melanie Montgomery is a 59 y.o. female.  No obstetric history on file. Postmenopausal, seen in 01/2020 for episode of VB vs. Hematuria. Her sx have resolved and she had f/u US for endometrial assessment and right ovarian cyst. CA125 was normal HPI  Past Medical History:  Diagnosis Date  . Anemia   . Anxiety   . Blood transfusion without reported diagnosis   . Cataract   . Depression     Past Surgical History:  Procedure Laterality Date  . CESAREAN SECTION    . FRACTURE SURGERY    . SPLENECTOMY, TOTAL  1987  . TUBAL LIGATION      Family History  Problem Relation Age of Onset  . Cancer Mother   . Diabetes Mother   . Breast cancer Mother 45  . Heart disease Father   . Diabetes Sister   . Colon cancer Sister   . Diabetes Brother   . Heart disease Brother   . Heart attack Brother   . Heart attack Brother 28  . Diabetes Sister   . Heart failure Sister   . Diabetes Sister   . Breast cancer Maternal Grandmother   . Esophageal cancer Maternal Uncle   . Rectal cancer Neg Hx   . Stomach cancer Neg Hx     Social History Social History   Tobacco Use  . Smoking status: Former Smoker    Packs/day: 2.00    Types: Cigarettes    Start date: 08/21/1981    Quit date: 01/02/2013    Years since quitting: 7.4  . Smokeless tobacco: Never Used  Vaping Use  . Vaping Use: Never used  Substance Use Topics  . Alcohol use: No  . Drug use: No    No Known Allergies  Current Outpatient Medications  Medication Sig Dispense Refill  . cyclobenzaprine (FLEXERIL) 10 MG tablet Take 1 tablet (10 mg total) by mouth 3 (three) times daily as needed for muscle spasms. 30 tablet 0  . diazepam (VALIUM) 2 MG tablet Take 1 tablet (2 mg total) by mouth every 12 (twelve) hours as needed for anxiety. 15 tablet 0  . diclofenac sodium (VOLTAREN) 1 %  GEL Apply 4 g topically 4 (four) times daily. 100 g 0  . estradiol (ESTRACE) 0.1 MG/GM vaginal cream Place 0.5 Applicatorfuls vaginally 3 (three) times a week. Apply at bedtime 42.5 g 2  . Multiple Vitamin (MULTIVITAMIN) tablet Take 1 tablet by mouth daily.    Marland Kitchen doxycycline (VIBRA-TABS) 100 MG tablet Take 1 tablet (100 mg total) by mouth 2 (two) times daily. 14 tablet 0   No current facility-administered medications for this visit.    Review of Systems Review of Systems  Constitutional: Negative.   Respiratory: Negative.   Gastrointestinal: Negative.   Genitourinary: Negative.     Blood pressure (!) 171/88, pulse (!) 110, weight 143 lb 14.4 oz (65.3 kg).  Physical Exam Physical Exam Vitals and nursing note reviewed.  Constitutional:      Appearance: Normal appearance.  Pulmonary:     Effort: Pulmonary effort is normal.  Abdominal:     General: There is no distension.  Neurological:     Mental Status: She is alert.  Psychiatric:        Mood and Affect: Mood normal.        Behavior: Behavior normal.  Data Reviewed CLINICAL DATA:  Right ovarian cyst  EXAM: TRANSABDOMINAL ULTRASOUND OF PELVIS  TECHNIQUE: Transabdominal ultrasound examination of the pelvis was performed including evaluation of the uterus, ovaries, adnexal regions, and pelvic cul-de-sac.  COMPARISON:  MRI 01/06/2020, pelvic sonogram 12/07/2019  FINDINGS: Uterus  Measurements: 10.2 x 2.4 x 4.8 cm = volume: 62 mL. No fibroids or other mass visualized.  Endometrium  Thickness: 3 mm.  No focal abnormality visualized.  Right ovary  Measurements: 3.8 x 1.8 x 2.5 cm = volume: 9 mL. A mildly complex cystic lesion is again identified within the right ovary measuring 1.6 x 1.9 x 1.6 cm demonstrating at least 1 thin internal septation. There is no associated internal vascularity or mural nodularity identified. No associated calcifications. This demonstrates interval decrease in size since  prior examination and can be safely considered benign.  Left ovary  Measurements: 3.7 x 1.6 x 1.7 cm = volume: 5 mL. Normal appearance/no adnexal mass.  Other findings:  No abnormal free fluid.  IMPRESSION: 1. Interval decrease in size of a mildly complex right ovarian cyst. This can be safely considered benign. 2. Interval resolution of previously noted endometrial thickening.   Electronically Signed   By: Fidela Salisbury MD   On: 05/21/2020 20:28  Assessment Cyst of right ovary  Vaginal spotting Cyst is resolved and no EM thickening on [place]    Plan Routine gyn care, sees PCP at [place] 05/31/2020, 4:25 PM

## 2020-06-18 ENCOUNTER — Inpatient Hospital Stay: Admission: RE | Admit: 2020-06-18 | Payer: 59 | Source: Ambulatory Visit

## 2020-06-25 ENCOUNTER — Ambulatory Visit
Admission: RE | Admit: 2020-06-25 | Discharge: 2020-06-25 | Disposition: A | Discharge status: Home / Self Care | Payer: 59 | Source: Ambulatory Visit | Attending: Family Medicine | Admitting: Family Medicine

## 2020-06-25 ENCOUNTER — Other Ambulatory Visit: Payer: Self-pay

## 2020-06-25 DIAGNOSIS — Z1231 Encounter for screening mammogram for malignant neoplasm of breast: Secondary | ICD-10-CM

## 2020-06-29 ENCOUNTER — Other Ambulatory Visit: Payer: Self-pay | Admitting: Family Medicine

## 2020-06-29 ENCOUNTER — Other Ambulatory Visit: Payer: Self-pay | Admitting: *Deleted

## 2020-06-29 ENCOUNTER — Ambulatory Visit: Payer: 59 | Admitting: Registered Nurse

## 2020-06-29 ENCOUNTER — Other Ambulatory Visit: Payer: Self-pay

## 2020-06-29 ENCOUNTER — Other Ambulatory Visit: Payer: Self-pay | Admitting: Registered Nurse

## 2020-06-29 ENCOUNTER — Encounter: Payer: Self-pay | Admitting: Registered Nurse

## 2020-06-29 VITALS — BP 142/88 | HR 68 | Temp 97.9°F | Resp 16 | Ht 62.0 in | Wt 150.2 lb

## 2020-06-29 DIAGNOSIS — R928 Other abnormal and inconclusive findings on diagnostic imaging of breast: Secondary | ICD-10-CM

## 2020-06-29 DIAGNOSIS — Z23 Encounter for immunization: Secondary | ICD-10-CM | POA: Diagnosis not present

## 2020-06-29 NOTE — Patient Instructions (Signed)
° ° ° °  If you have lab work done today you will be contacted with your lab results within the next 2 weeks.  If you have not heard from us then please contact us. The fastest way to get your results is to register for My Chart. ° ° °IF you received an x-ray today, you will receive an invoice from [facility]. Please contact  Radiology at [phone] with questions or concerns regarding your invoice.  ° °IF you received labwork today, you will receive an invoice from [facility]. Please contact [facility] at [phone] with questions or concerns regarding your invoice.  ° °Our billing staff will not be able to assist you with questions regarding bills from these companies. ° °You will be contacted with the lab results as soon as they are available. The fastest way to get your results is to activate your My Chart account. Instructions are located on the last page of this paperwork. If you have not heard from us regarding the results in 2 weeks, please contact this office. °  ° ° ° °

## 2020-06-29 NOTE — Progress Notes (Signed)
Established Patient Office Visit  Subjective:  Patient ID: Melanie Montgomery, female    DOB: Oct 25, 1960  Age: 59 y.o. MRN: [id number]  CC:  Chief Complaint  Patient presents with  . Breast Mass    pt had mammo monday 4 lumps found some calcification, pt needs follow up and discuss possibilities, pt grandmother died from breast cnacer and notes it runs in her family.     HPI Melanie Montgomery [facility] presents for follow up on abnormal mammogram Pt has extremely strong family history of cancers, including breast cancers in mom, mgm, mat aunt, and mgm sister.  Notes that some of these dx have come young, including mgm passing at 76 from breast ca  At this time her largest points of concern are to establish with a new provider as her past PCP - Dr Pamella Pert - recently left our group, and to discuss findings and next steps.   On review of imaging there was calcifications seen in R breast and possible lump in L breast. Neither of these have been palpable to her on self exam. Denies any changes in skin texture, heat, new vasculature, axillary lymph tenderness, nipple crusting or drainage, and systemic symptoms. Feeling well overall, just anxious to complete further imaging.   Past Medical History:  Diagnosis Date  . Anemia   . Anxiety   . Blood transfusion without reported diagnosis   . Cataract   . Depression     Past Surgical History:  Procedure Laterality Date  . CESAREAN SECTION    . FRACTURE SURGERY    . SPLENECTOMY, TOTAL  1987  . TUBAL LIGATION      Family History  Problem Relation Age of Onset  . Cancer Mother   . Diabetes Mother   . Breast cancer Mother 59  . Heart disease Father   . Diabetes Sister   . Colon cancer Sister   . Diabetes Brother   . Heart disease Brother   . Heart attack Brother   . Heart attack Brother 50  . Diabetes Sister   . Heart failure Sister   . Diabetes Sister   . Breast cancer Maternal Grandmother        passed at 23  . Esophageal  cancer Maternal Uncle   . Rectal cancer Neg Hx   . Stomach cancer Neg Hx     Social History   Socioeconomic History  . Marital status: Divorced    Spouse name: Not on file  . Number of children: 3  . Years of education: Not on file  . Highest education level: Not on file  Occupational History  . Occupation: security    Comment: business building  Tobacco Use  . Smoking status: Former Smoker    Packs/day: 2.00    Types: Cigarettes    Start date: 08/21/1981    Quit date: 01/02/2013    Years since quitting: 7.4  . Smokeless tobacco: Never Used  Vaping Use  . Vaping Use: Never used  Substance and Sexual Activity  . Alcohol use: No  . Drug use: No  . Sexual activity: Not Currently  Other Topics Concern  . Not on file  Social History Narrative   Lives at home alone.       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 Difficulty of  Paying Living Expenses: Not on file  Food Insecurity:   . Worried About Charity fundraiser in the Last Year: Not on file  . Ran Out of Food in the Last Year: Not on file  Transportation Needs:   . Lack of Transportation (Medical): Not on file  . Lack of Transportation (Non-Medical): Not on file  Physical Activity:   . Days of Exercise per Week: Not on file  . Minutes of Exercise per Session: Not on file  Stress:   . Feeling of Stress : Not on file  Social Connections:   . Frequency of Communication with Friends and Family: Not on file  . Frequency of Social Gatherings with Friends and Family: Not on file  . Attends Religious Services: Not on file  . Active Member of Clubs or Organizations: Not on file  . Attends Archivist Meetings: Not on file  . Marital Status: Not on file  Intimate Partner Violence:   . Fear of Current or Ex-Partner: Not on file  . Emotionally Abused: Not on file  . Physically Abused:  Not on file  . Sexually Abused: Not on file    Outpatient Medications Prior to Visit  Medication Sig Dispense Refill  . cyclobenzaprine (FLEXERIL) 10 MG tablet Take 1 tablet (10 mg total) by mouth 3 (three) times daily as needed for muscle spasms. 30 tablet 0  . diazepam (VALIUM) 2 MG tablet Take 1 tablet (2 mg total) by mouth every 12 (twelve) hours as needed for anxiety. 15 tablet 0  . diclofenac sodium (VOLTAREN) 1 % GEL Apply 4 g topically 4 (four) times daily. 100 g 0  . Multiple Vitamin (MULTIVITAMIN) tablet Take 1 tablet by mouth daily.    Marland Kitchen doxycycline (VIBRA-TABS) 100 MG tablet Take 1 tablet (100 mg total) by mouth 2 (two) times daily. (Patient not taking: Reported on 06/29/2020) 14 tablet 0  . estradiol (ESTRACE) 0.1 MG/GM vaginal cream Place 0.5 Applicatorfuls vaginally 3 (three) times a week. Apply at bedtime (Patient not taking: Reported on 06/29/2020) 42.5 g 2   No facility-administered medications prior to visit.    No Known Allergies  ROS Review of Systems  Constitutional: Negative.   HENT: Negative.   Eyes: Negative.   Respiratory: Negative.   Cardiovascular: Negative.   Gastrointestinal: Negative.   Genitourinary: Negative.   Musculoskeletal: Negative.   Skin: Negative.   Neurological: Negative.   Psychiatric/Behavioral: Negative.       Objective:    Physical Exam Vitals and nursing note reviewed.  Constitutional:      General: She is not in acute distress.    Appearance: Normal appearance. She is normal weight. She is not ill-appearing, toxic-appearing or diaphoretic.  Cardiovascular:     Rate and Rhythm: Normal rate and regular rhythm.     Heart sounds: Normal heart sounds. No murmur heard.  No friction rub. No gallop.   Pulmonary:     Effort: Pulmonary effort is normal. No respiratory distress.     Breath sounds: Normal breath sounds. No stridor. No wheezing, rhonchi or rales.  Chest:     Chest wall: No tenderness.  Skin:    General: Skin is  warm and dry.  Neurological:     General: No focal deficit present.     Mental Status: She is alert and oriented to person, place, and time. Mental status is at baseline.  Psychiatric:        Mood and Affect: Mood normal.  Behavior: Behavior normal.        Thought Content: Thought content normal.        Judgment: Judgment normal.     BP (!) 142/88   Pulse 68   Temp 97.9 F (36.6 C) (Temporal)   Resp 16   Ht 5\' 2"  (1.575 m)   Wt 150 lb 3.2 oz (68.1 kg)   SpO2 98%   BMI 27.47 kg/m  Wt Readings from Last 3 Encounters:  06/29/20 150 lb 3.2 oz (68.1 kg)  05/31/20 143 lb 14.4 oz (65.3 kg)  03/05/20 137 lb 12.8 oz (62.5 kg)     There are no preventive care reminders to display for this patient.  There are no preventive care reminders to display for this patient.  Lab Results  Component Value Date   TSH 1.710 12/05/2019   Lab Results  Component Value Date   WBC 12.7 (H) 05/25/2018   HGB 14.0 05/25/2018   HCT 40.7 05/25/2018   MCV 92 05/25/2018   PLT 406 05/25/2018   Lab Results  Component Value Date   NA 140 12/05/2019   K 4.1 12/05/2019   CO2 26 12/05/2019   GLUCOSE 89 12/05/2019   BUN 16 12/05/2019   CREATININE 0.51 (L) 12/05/2019   BILITOT 0.2 12/05/2019   ALKPHOS 136 (H) 12/05/2019   AST 14 12/05/2019   ALT 15 12/05/2019   PROT 7.3 12/05/2019   ALBUMIN 4.7 12/05/2019   CALCIUM 10.8 (H) 12/05/2019   Lab Results  Component Value Date   CHOL 173 12/05/2019   Lab Results  Component Value Date   HDL 58 12/05/2019   Lab Results  Component Value Date   LDLCALC 93 12/05/2019   Lab Results  Component Value Date   TRIG 125 12/05/2019   Lab Results  Component Value Date   CHOLHDL 3.0 12/05/2019   Lab Results  Component Value Date   HGBA1C 5.6 12/05/2019      Assessment & Plan:   Problem List Items Addressed This Visit    None    Visit Diagnoses    Abnormal mammogram of both breasts    -  Primary      No orders of the defined  types were placed in this encounter.   Follow-up: No follow-ups on file.   PLAN  Discussed that follow up imaging needs to take place. These may warrant biopsy. This will be more of a confirmation of any diagnosis.   Discussed other etiologies for lumps - fibroadenomas, cysts, dense tissue, etc.   Discussed care going forward - will plan follow up after this current issue is addressed.  Discussed that I myself or another provider will gladly be able to sign off on imaging orders.  Patient encouraged to call clinic with any questions, comments, or concerns.  I spent 35 minutes with this patient, including chart review, more than 50% of which was spent counseling and/or educating.  Maximiano Coss, NP

## 2020-07-18 ENCOUNTER — Other Ambulatory Visit: Payer: Self-pay | Admitting: Registered Nurse

## 2020-07-18 ENCOUNTER — Ambulatory Visit
Admission: RE | Admit: 2020-07-18 | Discharge: 2020-07-18 | Disposition: A | Discharge status: Home / Self Care | Payer: 59 | Source: Ambulatory Visit | Attending: Family Medicine | Admitting: Family Medicine

## 2020-07-18 ENCOUNTER — Ambulatory Visit: Payer: 59

## 2020-07-18 ENCOUNTER — Other Ambulatory Visit: Payer: Self-pay

## 2020-07-18 DIAGNOSIS — R928 Other abnormal and inconclusive findings on diagnostic imaging of breast: Secondary | ICD-10-CM

## 2020-08-13 ENCOUNTER — Encounter: Payer: 59 | Admitting: Family Medicine

## 2020-11-08 ENCOUNTER — Telehealth: Payer: Self-pay | Admitting: Registered Nurse

## 2020-11-08 NOTE — Telephone Encounter (Signed)
Patient is wondering if Dr. Mitchel Honour will be taking new patients when he gets here, and if so she would like to become a new patient. She currently sees a doctor at the same office he is coming from.

## 2020-11-09 NOTE — Telephone Encounter (Signed)
Please advise. Thank you.  Amy

## 2020-11-12 ENCOUNTER — Encounter: Payer: Self-pay | Admitting: Registered Nurse

## 2020-11-12 MED ORDER — DIAZEPAM 2 MG PO TABS: 2.0000 mg | ORAL_TABLET | Freq: Two times a day (BID) | ORAL | 0 refills | Status: DC | PRN

## 2020-11-12 NOTE — Telephone Encounter (Signed)
Patient is requesting a refill of the following medications: Requested Prescriptions   Pending Prescriptions Disp Refills  . diazepam (VALIUM) 2 MG tablet 15 tablet 0    Sig: Take 1 tablet (2 mg total) by mouth every 12 (twelve) hours as needed for anxiety.    Date of patient request: 11/12/2020 Last office visit: 06/29/2020 Date of last refill: 04/19/2020 Last refill amount: 15

## 2020-11-21 ENCOUNTER — Ambulatory Visit: Payer: 59 | Admitting: Registered Nurse

## 2021-01-16 ENCOUNTER — Other Ambulatory Visit: Payer: Self-pay | Admitting: Registered Nurse

## 2021-01-16 ENCOUNTER — Other Ambulatory Visit: Payer: Self-pay

## 2021-01-16 ENCOUNTER — Ambulatory Visit
Admission: RE | Admit: 2021-01-16 | Discharge: 2021-01-16 | Disposition: A | Discharge status: Home / Self Care | Payer: 59 | Source: Ambulatory Visit | Attending: Registered Nurse | Admitting: Registered Nurse

## 2021-01-16 DIAGNOSIS — R921 Mammographic calcification found on diagnostic imaging of breast: Secondary | ICD-10-CM

## 2021-01-16 DIAGNOSIS — R928 Other abnormal and inconclusive findings on diagnostic imaging of breast: Secondary | ICD-10-CM

## 2021-01-18 ENCOUNTER — Ambulatory Visit
Admission: RE | Admit: 2021-01-18 | Discharge: 2021-01-18 | Disposition: A | Discharge status: Home / Self Care | Payer: 59 | Source: Ambulatory Visit | Attending: Registered Nurse | Admitting: Registered Nurse

## 2021-01-18 ENCOUNTER — Other Ambulatory Visit: Payer: Self-pay

## 2021-01-18 ENCOUNTER — Other Ambulatory Visit: Payer: Self-pay | Admitting: Registered Nurse

## 2021-01-18 DIAGNOSIS — R921 Mammographic calcification found on diagnostic imaging of breast: Secondary | ICD-10-CM

## 2021-01-18 IMAGING — MG MM BREAST BX W/ LOC DEV 1ST LESION IMAGE BX SPEC STEREO GUIDE*R*
7 of 14 series · 7 of 34 positions shown · non-contrast
Comparison: Previous exams.
COMPARISON: Previous exams.

Addendum:
CLINICAL DATA: 60-year-old female presenting for stereotactic
biopsy of 2 sites in the right breast.

EXAM:
RIGHT BREAST STEREOTACTIC CORE NEEDLE BIOPSY

[R (1 of 6)]
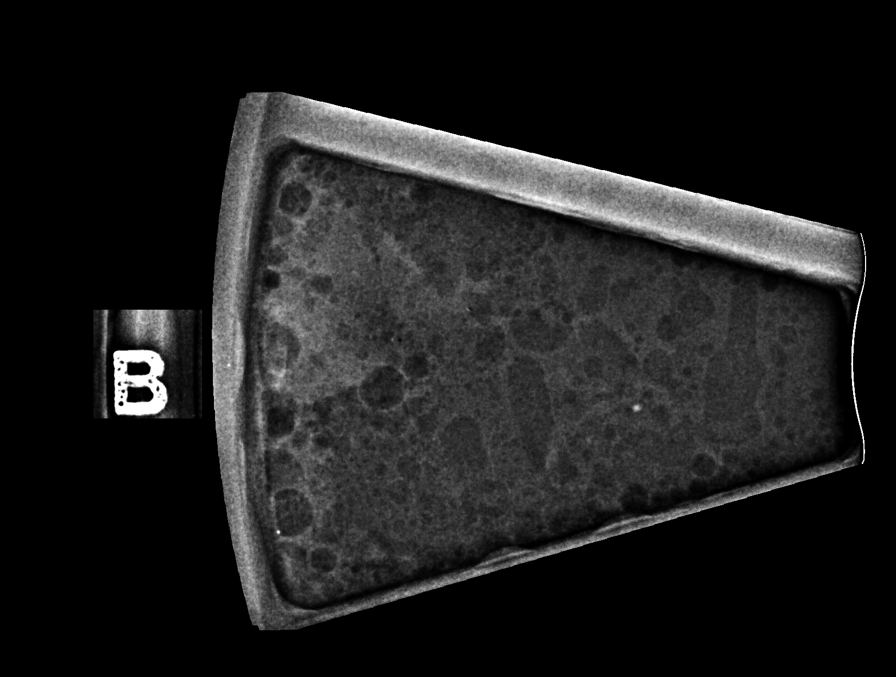

[R (2 of 6)]
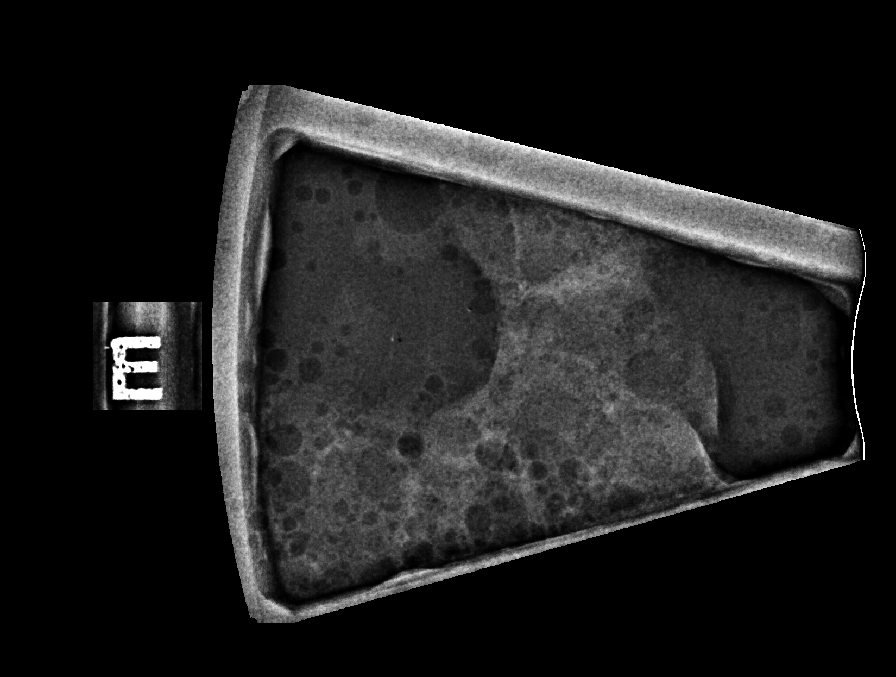

[R (3 of 6)]
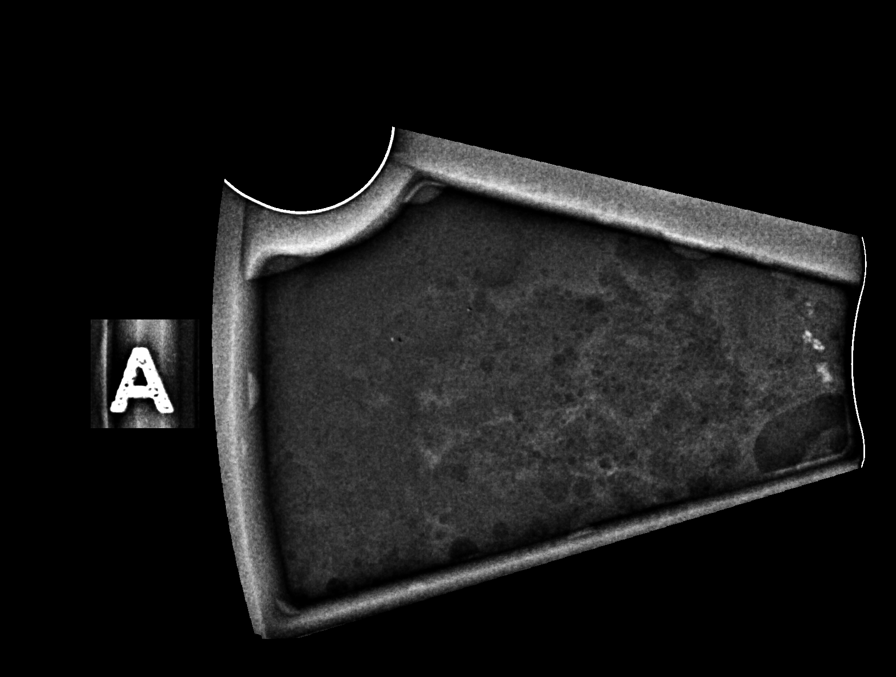

[R (4 of 6)]
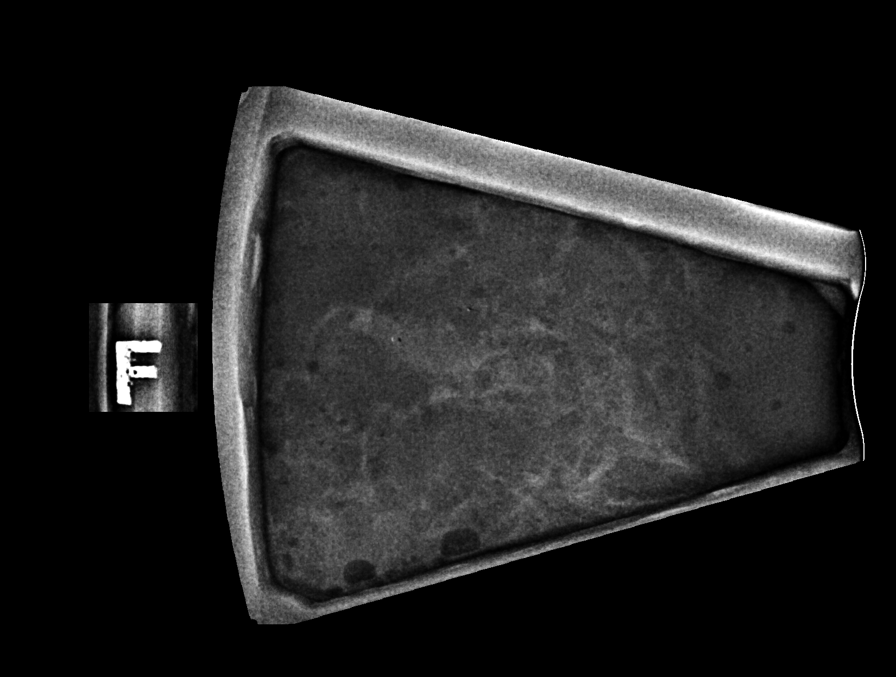

[R (5 of 6)]
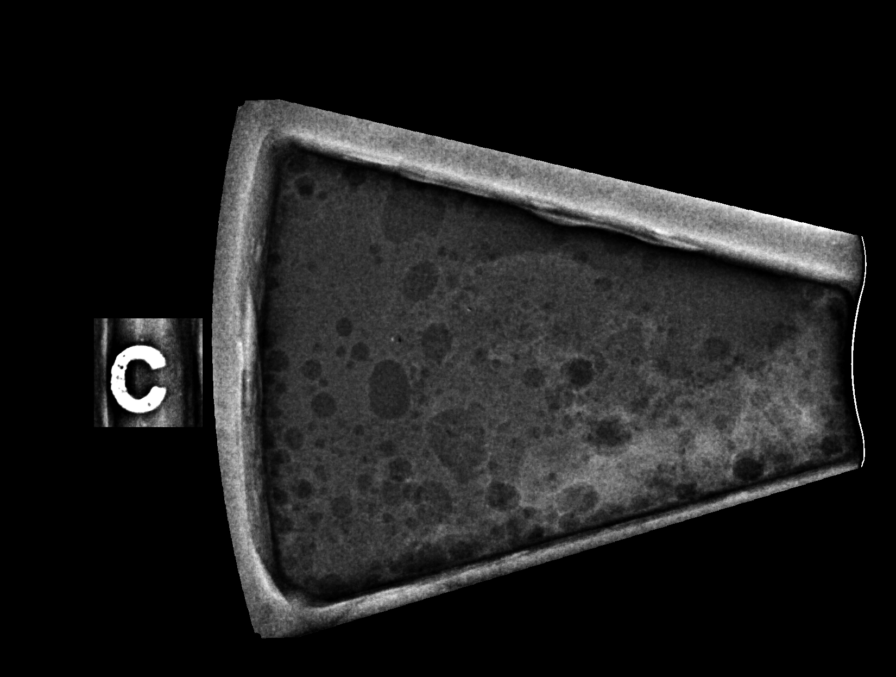

[R (6 of 6)]
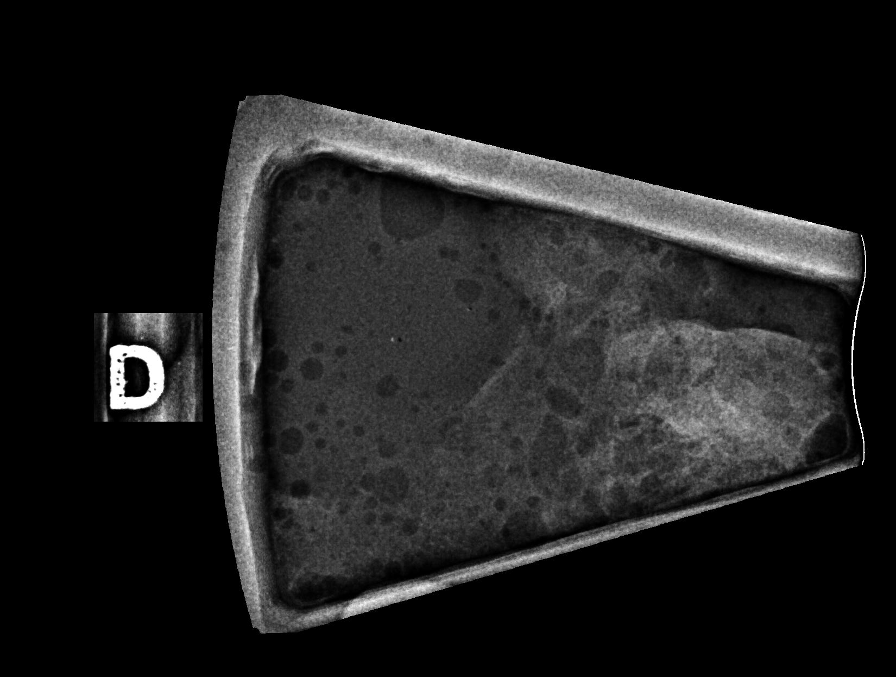

[R LM]
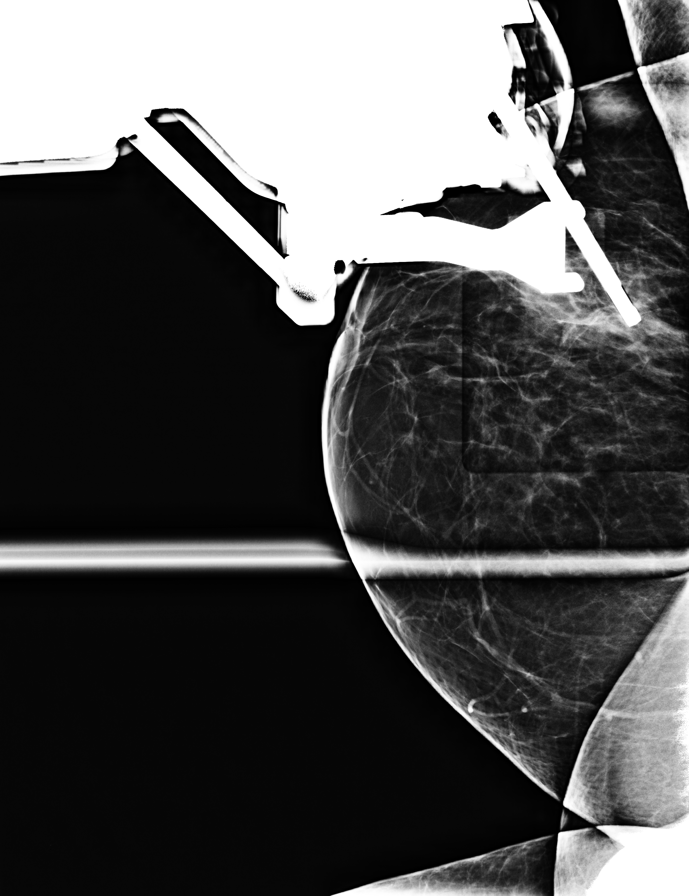

[7 of 34 positions shown; findings below may reference images not displayed]



#1

Using sterile technique and 1% Lidocaine as local anesthetic, under
stereotactic guidance, a 9 gauge vacuum assisted device was used to
perform core needle biopsy of calcifications in the lower outer
quadrant of the right breast, middle depth using a lateral approach.
Specimen radiograph was performed showing calcifications in multiple
core samples. Specimens with calcifications are identified for
pathology.

Lesion quadrant: Lower outer quadrant

At the conclusion of the procedure, a coil shaped tissue marker clip
was deployed into the biopsy cavity.

--------------------------------------------------------------------------------------------------------------------

#2

Using sterile technique and 1% Lidocaine as local anesthetic, under
stereotactic guidance, a 9 gauge vacuum assisted device was used to
perform core needle biopsy of calcifications in the lower outer
quadrant of the right breast, anterior depth using a lateral
approach. Specimen radiograph was performed showing calcifications
in at least 2 core samples. Specimens with calcifications are
identified for pathology.

Lesion quadrant: Lower outer quadrant

At the conclusion of the procedure, an X shaped tissue marker clip
was deployed into the biopsy cavity. Follow-up 2-view mammogram was
performed and dictated separately.
IMPRESSION: Stereotactic-guided biopsy of 2 groups of calcifications in the
lower outer right breast. No apparent complications.

ADDENDUM:
Pathology revealed COMPLEX SCLEROSING LESION WITH CALCIFICATIONS of
the RIGHT breast, lower outer, middle depth. This was found to be
concordant by Dr. TASHAYA, with surgical consultation for
consideration of excision recommended.

If excision not performed, recommend Right diagnostic mammogram with
possible ultrasound in 6 months to monitor the biopsy site.

Pathology revealed USUAL DUCTAL HYPERPLASIA AND FIBROCYSTIC CHANGES
WITH APOCRINE METAPLASIA, CALCIFICATIONS of the RIGHT breast, lower
outer, anterior depth. This was found to be concordant by Dr.
TASHAYA.

Pathology results were discussed with the patient by telephone. The
patient reported doing well after the biopsies with tenderness and
bruising at the sites. Post biopsy instructions and care were
reviewed and questions were answered. The patient was encouraged to
call The [REDACTED] for any additional
concerns. My direct phone number was provided.

Surgical consultation has been arranged with Dr. TASHAYA at
[REDACTED] on [DATE].

Given the patient's very strong family history of breast cancer, and
the fact that she has a cousin who has been genetically tested, and
has a high risk genetic mutation, genetic counseling and genetic
testing for this patient should be strongly considered.

Consideration for a bilateral breast MRI prior to surgery to exclude
any additional sites of disease given the patient's family history.

The patient is scheduled for a LEFT diagnostic mammogram at The

Pathology results reported by TASHAYA, RN on [DATE].



#1

Using sterile technique and 1% Lidocaine as local anesthetic, under
stereotactic guidance, a 9 gauge vacuum assisted device was used to
perform core needle biopsy of calcifications in the lower outer
quadrant of the right breast, middle depth using a lateral approach.
Specimen radiograph was performed showing calcifications in multiple
core samples. Specimens with calcifications are identified for
pathology.

Lesion quadrant: Lower outer quadrant

At the conclusion of the procedure, a coil shaped tissue marker clip
was deployed into the biopsy cavity.

--------------------------------------------------------------------------------------------------------------------

#2

Using sterile technique and 1% Lidocaine as local anesthetic, under
stereotactic guidance, a 9 gauge vacuum assisted device was used to
perform core needle biopsy of calcifications in the lower outer
quadrant of the right breast, anterior depth using a lateral
approach. Specimen radiograph was performed showing calcifications
in at least 2 core samples. Specimens with calcifications are
identified for pathology.

Lesion quadrant: Lower outer quadrant

At the conclusion of the procedure, an X shaped tissue marker clip
was deployed into the biopsy cavity. Follow-up 2-view mammogram was
performed and dictated separately.
IMPRESSION: Stereotactic-guided biopsy of 2 groups of calcifications in the
lower outer right breast. No apparent complications.

## 2021-01-22 ENCOUNTER — Encounter: Payer: Self-pay | Admitting: Registered Nurse

## 2021-01-22 ENCOUNTER — Other Ambulatory Visit: Payer: Self-pay | Admitting: Registered Nurse

## 2021-01-22 DIAGNOSIS — N6489 Other specified disorders of breast: Secondary | ICD-10-CM

## 2021-01-22 NOTE — Telephone Encounter (Signed)
Pt would like referral to have genetic testing done, would you like OV first or send referral? Please advise -nonurgent

## 2021-01-25 ENCOUNTER — Other Ambulatory Visit: Payer: Self-pay

## 2021-01-25 ENCOUNTER — Other Ambulatory Visit: Payer: Self-pay | Admitting: Registered Nurse

## 2021-01-25 ENCOUNTER — Ambulatory Visit
Admission: RE | Admit: 2021-01-25 | Discharge: 2021-01-25 | Disposition: A | Discharge status: Home / Self Care | Payer: 59 | Source: Ambulatory Visit | Attending: Registered Nurse | Admitting: Registered Nurse

## 2021-01-25 DIAGNOSIS — N6489 Other specified disorders of breast: Secondary | ICD-10-CM

## 2021-01-25 NOTE — Telephone Encounter (Signed)
Patient has scheduled my chart visit for 5/24

## 2021-01-29 ENCOUNTER — Telehealth (INDEPENDENT_AMBULATORY_CARE_PROVIDER_SITE_OTHER): Payer: 59 | Admitting: Registered Nurse

## 2021-01-29 DIAGNOSIS — Z803 Family history of malignant neoplasm of breast: Secondary | ICD-10-CM

## 2021-01-29 DIAGNOSIS — N63 Unspecified lump in unspecified breast: Secondary | ICD-10-CM | POA: Diagnosis not present

## 2021-01-29 NOTE — Progress Notes (Signed)
Telemedicine Encounter- SOAP NOTE Established Patient  This telephone encounter was conducted with the patient's (or proxy's) verbal consent via audio telecommunications: yes  Patient was instructed to have this encounter in a suitably private space; and to only have persons present to whom they give permission to participate. In addition, patient identity was confirmed by use of name plus two identifiers (DOB and address).  I discussed the limitations, risks, security and privacy concerns of performing an evaluation and management service by telephone and the availability of in person appointments. I also discussed with the patient that there may be a patient responsible charge related to this service. The patient expressed understanding and agreed to proceed.  I spent a total of 16 minutes talking with the patient or their proxy.  No chief complaint on file.   Subjective   Melanie Montgomery is a 60 y.o. established patient. Telephone visit today for genetic testing  HPI Fam hx of breast ca,  MGM passed at 53 with breast ca Mother, mat aunt x2, cousin (dec), all with cancer.  MGF with cancer  Has consult with gen surg on June 20 for lumpectomy Benign lump found on mammography with subsequent biopsy  Interested in genetic testing to  Help stratify her risk and consider options such as mastectomy or MRI screenings.  Patient Active Problem List   Diagnosis Date Noted  . Cyst of right ovary 01/25/2020  . Vaginal spotting 01/25/2020  . Macular degeneration 05/25/2018  . Vitamin D deficiency 04/13/2017  . Decreased vision in both eyes 04/06/2017  . 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 Insomnia 03/23/2017    Past Medical History:  Diagnosis Date  . Anemia   . Anxiety   . Blood transfusion without reported diagnosis   . Cataract   . Depression     Current Outpatient Medications  Medication Sig Dispense Refill  . cyclobenzaprine (FLEXERIL) 10 MG tablet Take 1 tablet  (10 mg total) by mouth 3 (three) times daily as needed for muscle spasms. 30 tablet 0  . diazepam (VALIUM) 2 MG tablet Take 1 tablet (2 mg total) by mouth every 12 (twelve) hours as needed for anxiety. 15 tablet 0  . diclofenac sodium (VOLTAREN) 1 % GEL Apply 4 g topically 4 (four) times daily. 100 g 0  . Multiple Vitamin (MULTIVITAMIN) tablet Take 1 tablet by mouth daily.     No current facility-administered medications for this visit.    No Known Allergies  Social History   Socioeconomic History  . Marital status: Divorced    Spouse name: Not on file  . Number of children: 3  . Years of education: Not on file  . Highest education level: Not on file  Occupational History  . Occupation: security    Comment: business building  Tobacco Use  . Smoking status: Former Smoker    Packs/day: 2.00    Types: Cigarettes    Start date: 08/21/1981    Quit date: 01/02/2013    Years since quitting: 8.0  . Smokeless tobacco: Never Used  Vaping Use  . Vaping Use: Never used  Substance and Sexual Activity  . Alcohol use: No  . Drug use: No  . Sexual activity: Not Currently  Other Topics Concern  . Not on file  Social History Narrative   Lives at home alone.       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  There are no diagnoses linked to this encounter.  PLAN  Engaged in discussion of ethical concerns regarding genetic testing, including emotional reaction to either good or bad news.  Discussed disease process   Will refer  Patient  encouraged to call clinic with any questions, comments, or concerns.  I discussed the assessment and treatment plan with the patient. The patient was provided an opportunity to ask questions and all were answered. The patient agreed with the plan and demonstrated an understanding of the instructions.   The patient was advised to call back or seek an in-person evaluation if the symptoms worsen or if the condition fails to improve as anticipated.  I provided 15 minutes of non-face-to-face time during this encounter.  Maximiano Coss, NP  Primary Care at [facility]

## 2021-01-31 ENCOUNTER — Encounter: Payer: Self-pay | Admitting: Registered Nurse

## 2021-01-31 ENCOUNTER — Other Ambulatory Visit: Payer: Self-pay | Admitting: Registered Nurse

## 2021-01-31 DIAGNOSIS — D249 Benign neoplasm of unspecified breast: Secondary | ICD-10-CM

## 2021-01-31 DIAGNOSIS — R928 Other abnormal and inconclusive findings on diagnostic imaging of breast: Secondary | ICD-10-CM

## 2021-02-06 ENCOUNTER — Telehealth: Payer: Self-pay | Admitting: Genetic Counselor

## 2021-02-06 ENCOUNTER — Other Ambulatory Visit: Payer: Self-pay

## 2021-02-06 ENCOUNTER — Encounter: Payer: Self-pay | Admitting: Registered Nurse

## 2021-02-06 NOTE — Telephone Encounter (Signed)
Received a genetic counseling referral from Maximiano Coss, NP for fhx of breast cancer. MS. Wassel returned my call and has been scheduled to see Raquel Sarna on 6/14 at 2pm. Pt aware to arrive 20 minutes early.

## 2021-02-06 NOTE — Telephone Encounter (Signed)
I need refill on diazapam please!  LFD 11/12/20 #15 with no refills LOV 01/09/21 NOV 03/01/21

## 2021-02-08 MED ORDER — DIAZEPAM 2 MG PO TABS: 2.0000 mg | ORAL_TABLET | Freq: Two times a day (BID) | ORAL | 0 refills | Status: DC | PRN

## 2021-02-19 ENCOUNTER — Inpatient Hospital Stay: Payer: 59

## 2021-02-19 ENCOUNTER — Inpatient Hospital Stay: Payer: 59 | Attending: Genetic Counselor | Admitting: Genetic Counselor

## 2021-02-19 ENCOUNTER — Other Ambulatory Visit: Payer: Self-pay

## 2021-02-19 DIAGNOSIS — Z808 Family history of malignant neoplasm of other organs or systems: Secondary | ICD-10-CM

## 2021-02-19 DIAGNOSIS — Z8043 Family history of malignant neoplasm of testis: Secondary | ICD-10-CM

## 2021-02-19 DIAGNOSIS — Z801 Family history of malignant neoplasm of trachea, bronchus and lung: Secondary | ICD-10-CM | POA: Diagnosis not present

## 2021-02-19 DIAGNOSIS — Z803 Family history of malignant neoplasm of breast: Secondary | ICD-10-CM | POA: Diagnosis not present

## 2021-02-19 DIAGNOSIS — Z800 Family history of malignant neoplasm of digestive organs: Secondary | ICD-10-CM

## 2021-02-19 LAB — GENETIC SCREENING ORDER

## 2021-02-19 NOTE — Progress Notes (Signed)
REFERRING PROVIDER: Maximiano Coss, NP [address] [place],  [place] [address]  PRIMARY PROVIDER:  Maximiano Coss, NP  PRIMARY REASON FOR VISIT:  1. Family history of breast cancer   2. Family history of colon cancer   3. Family history of stomach cancer   4. Family history of brain cancer   5. Family history of lung cancer   6. Family history of testicular cancer   7. Family history of esophageal cancer       HISTORY OF PRESENT ILLNESS:   Ms. Bordeau, a 60 y.o. female, was seen for a [place] cancer genetics consultation at the request of Dr. Orland Mustard due to a family history of cancer.  Ms. Canniff presents to clinic today to discuss the possibility of a hereditary predisposition to cancer, genetic testing, and to further clarify her future cancer risks, as well as potential cancer risks for family members.   Ms. Valtierra does not have a personal history of cancer.    RISK FACTORS:  Menarche was at age 4.  First live birth at age 82.  OCP use for approximately 0 years.  Ovaries intact: yes.  Hysterectomy: no.  Menopausal status: postmenopausal.  HRT use: none. Colonoscopy: yes;  07/19/2018 - two polyps . Mammogram within the last year: yes. Number of breast biopsies: 3 - complex sclerosing lesion & usual ductal hyperplasia. Any excessive radiation exposure in the past: no.  Past Medical History:  Diagnosis Date   Anemia    Anxiety    Blood transfusion without reported diagnosis    Cataract    Depression    Family history of brain cancer    Family history of breast cancer    Family history of colon cancer    Family history of esophageal cancer    Family history of lung cancer    Family history of stomach cancer    Family history of testicular cancer     Past Surgical History:  Procedure Laterality Date   CESAREAN SECTION     FRACTURE SURGERY     SPLENECTOMY, TOTAL  1987   TUBAL LIGATION      Social History   Socioeconomic History    Marital status: Divorced    Spouse name: Not on file   Number of children: 3   Years of education: Not on file   Highest education level: Not on file  Occupational History   Occupation: security    Comment: business building  Tobacco Use   Smoking status: Former    Packs/day: 2.00    Pack years: 0.00    Types: Cigarettes    Start date: 08/21/1981    Quit date: 01/02/2013    Years since quitting: 8.1   Smokeless tobacco: Never  Vaping Use   Vaping Use: Never used  Substance and Sexual Activity   Alcohol use: No   Drug use: No   Sexual activity: Not Currently  Other Topics Concern   Not on file  Social History Narrative   Lives at home alone.       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  Significant diagnoses are listed below: Family History  Problem Relation Age of Onset   Diabetes Mother    Breast cancer Mother 67   Heart disease Father    Diabetes Sister    Colon cancer Sister 71   Diabetes Sister    Heart failure Sister    Diabetes Sister    Diabetes Brother    Heart disease Brother    Heart attack Brother    Heart attack Brother 51   Breast cancer Maternal Aunt        dx 73s   Lung cancer Maternal Aunt        hx smoking   Esophageal cancer Maternal Uncle    Stomach cancer Paternal Aunt        dx <50   Brain cancer Paternal Uncle        died 14s   Cirrhosis Paternal Uncle    Breast cancer Maternal Grandmother        passed at 85   Cancer Maternal Grandfather        unknown "cauliflower" cancer on his side   Breast cancer Cousin        dx 36s (maternal first cousin)   Breast cancer Cousin         dx <50 (maternal first cousin)   Brain cancer Cousin        dx 15s (maternal first cousin)   Cancer Cousin 19       oral cancer (maternal first cousin)   Cancer Cousin 76       unknown cancer (maternal first cousin)   Testicular cancer Nephew 21   Rectal cancer Neg Hx    Ms. Graw has three daughters (ages 70-43). She had seven brothers and six sisters. One sister had colon cancer at age 2. This sister's son had testicular cancer at age 67.  Ms. Coggeshall mother died at age 4 and had a history of breast cancer diagnosed at age 20. There were four maternal aunts and three maternal uncles. One aunt had breast cancer in her 33s. Another aunt died from lung cancer in her 45s. One uncle died from esophageal cancer at age 60. Several cousins had cancer - one had breast cancer diagnosed younger than 97, another had breast cancer diagnosed in her 30s, a third died from brain cancer in his 2s, a 40 had oral cancer at age 21, and a fifth died from an unknown cancer at age 79. A maternal cousin once removed had an unknown cancer. Ms. Szeliga maternal grandmother died at age 25 with breast cancer (unknown age of diagnosis). Her maternal grandfather died in his 76s with a "cauliflower" type of cancer on his side.   Ms. Carreto father died at age 19 without cancer. There were two paternal aunts and three paternal uncles. One aunt had stomach cancer diagnosed younger than 72. One uncle died from brain cancer in his 91s. There is no known cancer among paternal cousins. Ms. Mallozzi paternal grandparents died in their 58s without cancer.   Ms. Medellin is unaware of previous family history of genetic testing for hereditary cancer risks. Patient's maternal ancestors are of [place] and [place] Native American descent, and paternal ancestors are of [place] and [place] Native American descent. There is no reported Ashkenazi Jewish ancestry. There is no known consanguinity.  GENETIC  COUNSELING ASSESSMENT: Ms. Basden is a 60 y.o. female with a family history of cancer which is somewhat suggestive  of a hereditary cancer syndrome and predisposition to cancer. We, therefore, discussed and recommended the following at today's visit.   DISCUSSION: We discussed that approximately 5-10% of cancer is hereditary, with most cases of hereditary breast cancer associated with the BRCA1 and BRCA2 genes. There are other genes that can be associated with hereditary breast cancer syndromes. These include ATM, CHEK2, PALB2, etc. Additional, genes known to be associated with hereditary colorectal cancer include the Lynch syndrome genes, APC, MUTYH, CHEK2, etc. We discussed that testing is beneficial for several reasons, including knowing about other cancer risks, identifying potential screening and risk-reduction options that may be appropriate, and to understand if other family members could be at risk for cancer and allow them to undergo genetic testing.  We reviewed the characteristics, features and inheritance patterns of hereditary cancer syndromes. We also discussed genetic testing, including the appropriate family members to test, the process of testing, insurance coverage and turn-around-time for results. We discussed the implications of a negative, positive and/or variant of uncertain significant result. We recommended Ms. Cooperwood pursue genetic testing for the [facility] Hereditary Breast and Ovarian Cancer Syndrome Panel with reflex to the Common Hereditary Cancers + RNA Panel.   The Hereditary Breast and Ovarian Cancer Syndrome Panel offered by [facility] includes sequencing and deletion/duplication analysis of the BRCA1 and BRCA2 genes. The Common Hereditary Cancers Panel offered by [facility] includes sequencing and/or deletion duplication testing of the following 47genes: APC*, ATM*, AXIN2*, BARD1*, BMPR1A*, BRCA1*, BRCA2*, BRIP1*, CDH1*, CDK4, CDKN2A (p14ARF), CDKN2A (p16INK4a), CHEK2*,  CTNNA1*, DICER1*, EPCAM (Deletion/duplication testing only), GREM1 (promoter region deletion/duplication testing only), KIT, MEN1*, MLH1*, MSH2*, MSH3*, MSH6*, MUTYH*, NBN*, NF1*, NTHL1, PALB2*, PDGFRA, PMS2*, POLD1*, POLE*, PTEN*, RAD50*, RAD51C*, RAD51D*, SDHB*, SDHC*, SDHD*, SMAD4*, SMARCA4*, STK11*, TP53*, TSC1*, TSC2*, and VHL*.  The following genes were evaluated for sequence changes only: SDHA* and HOXB13 c.251G>A variant only. RNA analysis performed for * genes.   Based on Ms. Purdie's family history of cancer, she meets medical criteria for genetic testing. Despite that she meets criteria, there may still be an out of pocket cost.  We discussed that some people do not want to undergo genetic testing due to fear of genetic discrimination. A federal law called the Genetic Information Non-Discrimination Act (GINA) of 2008 helps protect individuals against genetic discrimination based on their genetic test results. It impacts both health insurance and employment. With health insurance, it protects against increased premiums, being kicked off insurance or being forced to take a test in order to be insured. For employment it protects against hiring, firing and promoting decisions based on genetic test results. Health status due to a cancer diagnosis is not protected under GINA. Additionally, life, disability, and long-term care insurance is not protected under GINA.  PLAN: After considering the risks, benefits, and limitations, Ms. Grace provided informed consent to pursue genetic testing and the blood sample was sent to [facility] for analysis of the Hereditary Breast and Ovarian Cancer Syndrome Panel with reflex to the Common Hereditary Cancers + RNA panel. Results should be available within approximately two-three weeks' time, at which point they will be disclosed by telephone to Ms. Diodato, as will any additional recommendations warranted by these results. Ms. Kiehn will  receive a summary of her genetic counseling visit and a copy of her results once available. This information will also be available in Epic.   Ms. Laumann questions were answered to her satisfaction today. Our contact information was provided should additional questions or concerns arise. Thank you  for the referral and allowing [place] to share in the care of your patient.   Clint Guy, [place], [facility] Licensed, Certified Dispensing [email or web address] Phone: [phone]  The patient was seen for a total of 40 minutes in face-to-face genetic counseling. Patient was seen alone. This patient was discussed with Drs. Magrinat, Lindi Adie and/or Burr Medico who agrees with the above.    _______________________________________________________________________ For Office Staff:  Number of people involved in session: 1 Was an Intern/ student involved with case: no

## 2021-02-20 ENCOUNTER — Encounter: Payer: Self-pay | Admitting: Genetic Counselor

## 2021-02-20 DIAGNOSIS — Z808 Family history of malignant neoplasm of other organs or systems: Secondary | ICD-10-CM | POA: Insufficient documentation

## 2021-02-20 DIAGNOSIS — Z801 Family history of malignant neoplasm of trachea, bronchus and lung: Secondary | ICD-10-CM | POA: Insufficient documentation

## 2021-02-20 DIAGNOSIS — Z8043 Family history of malignant neoplasm of testis: Secondary | ICD-10-CM | POA: Insufficient documentation

## 2021-02-20 DIAGNOSIS — Z803 Family history of malignant neoplasm of breast: Secondary | ICD-10-CM | POA: Insufficient documentation

## 2021-02-20 DIAGNOSIS — Z800 Family history of malignant neoplasm of digestive organs: Secondary | ICD-10-CM | POA: Insufficient documentation

## 2021-02-22 ENCOUNTER — Telehealth: Payer: Self-pay | Admitting: Genetic Counselor

## 2021-02-22 NOTE — Telephone Encounter (Signed)
Returned Ms. Ciesla's message regarding genetic testing for heart disease. Discussed that the hereditary cancer gene testing will not assess for one's genetic risk to develop heart disease, and that most familial heart disease shows multifactorial inheritance. This means that there are many different factors contributing towards someone's risk to develop heart disease (including both genetic and environmental factors), although the majority of these genetic variants individually contribute only a small amount toward that risk.

## 2021-02-25 ENCOUNTER — Other Ambulatory Visit: Payer: Self-pay

## 2021-02-26 ENCOUNTER — Ambulatory Visit (INDEPENDENT_AMBULATORY_CARE_PROVIDER_SITE_OTHER): Payer: 59 | Admitting: Internal Medicine

## 2021-02-26 ENCOUNTER — Encounter: Payer: Self-pay | Admitting: Internal Medicine

## 2021-02-26 VITALS — BP 160/86 | HR 79 | Temp 98.0°F | Resp 16 | Ht 62.0 in | Wt 155.0 lb

## 2021-02-26 DIAGNOSIS — I10 Essential (primary) hypertension: Secondary | ICD-10-CM | POA: Insufficient documentation

## 2021-02-26 DIAGNOSIS — I119 Hypertensive heart disease without heart failure: Secondary | ICD-10-CM | POA: Diagnosis not present

## 2021-02-26 DIAGNOSIS — Z0001 Encounter for general adult medical examination with abnormal findings: Secondary | ICD-10-CM | POA: Diagnosis not present

## 2021-02-26 DIAGNOSIS — R072 Precordial pain: Secondary | ICD-10-CM | POA: Insufficient documentation

## 2021-02-26 NOTE — Progress Notes (Signed)
Lvh Hypertension   Subjective:  Patient ID: Melanie Montgomery, female    DOB: 03/31/1961  Age: 60 y.o. MRN: [id number]  CC: Annual Exam and Hypertension  This visit occurred during the SARS-CoV-2 public health emergency.  Safety protocols were in place, including screening questions prior to the visit, additional usage of staff PPE, and extensive cleaning of exam room while observing appropriate contact time as indicated for disinfecting solutions.    HPI Melanie Montgomery [facility] presents for a CPX, to establish, and f/up.  She has a history of hypertension but attributes this to the whitecoat phenomenon.  She has chronic intermittent chest pain.  She describes a pressure sensation under her sternum and an intermittent sharp pain on the left side of her chest.  The pain occurs at rest and with activity.  She denies diaphoresis, dyspnea on exertion, palpitations, edema, fatigue, headache, or blurred vision.  Outpatient Medications Prior to Visit  Medication Sig Dispense Refill   cyclobenzaprine (FLEXERIL) 10 MG tablet Take 1 tablet (10 mg total) by mouth 3 (three) times daily as needed for muscle spasms. 30 tablet 0   diazepam (VALIUM) 2 MG tablet Take 1 tablet (2 mg total) by mouth every 12 (twelve) hours as needed for anxiety. 15 tablet 0   diclofenac sodium (VOLTAREN) 1 % GEL Apply 4 g topically 4 (four) times daily. 100 g 0   Multiple Vitamin (MULTIVITAMIN) tablet Take 1 tablet by mouth daily.     No facility-administered medications prior to visit.    ROS Review of Systems  Constitutional:  Negative for diaphoresis, fatigue and unexpected weight change.  HENT: Negative.  Negative for sore throat.   Respiratory:  Positive for chest tightness. Negative for cough, shortness of breath and wheezing.   Cardiovascular:  Positive for chest pain. Negative for palpitations and leg swelling.  Gastrointestinal:  Negative for abdominal pain, constipation, diarrhea, nausea and vomiting.   Endocrine: Negative.   Genitourinary: Negative.  Negative for difficulty urinating, frequency and urgency.  Musculoskeletal: Negative.   Skin: Negative.  Negative for color change.  Neurological:  Negative for dizziness, weakness, light-headedness and headaches.  Hematological:  Negative for adenopathy. Does not bruise/bleed easily.  Psychiatric/Behavioral: Negative.     Objective:  BP (!) 160/86 (BP Location: Left Arm, Patient Position: Sitting, Cuff Size: Large)   Pulse 79   Temp 98 F (36.7 C) (Oral)   Resp 16   Ht 5\' 2"  (1.575 m)   Wt 155 lb (70.3 kg)   SpO2 96%   BMI 28.35 kg/m   BP Readings from Last 3 Encounters:  02/26/21 (!) 160/86  06/29/20 (!) 142/88  05/31/20 (!) 171/88    Wt Readings from Last 3 Encounters:  02/26/21 155 lb (70.3 kg)  06/29/20 150 lb 3.2 oz (68.1 kg)  05/31/20 143 lb 14.4 oz (65.3 kg)    Physical Exam Vitals reviewed.  Constitutional:      Appearance: Normal appearance.  HENT:     Nose: Nose normal.     Mouth/Throat:     Mouth: Mucous membranes are moist.  Eyes:     General: No scleral icterus.    Conjunctiva/sclera: Conjunctivae normal.  Cardiovascular:     Rate and Rhythm: Normal rate and regular rhythm.     Heart sounds: Normal heart sounds, S1 normal and S2 normal. No murmur heard.   No gallop.     Comments: EKG- NSR, 79 bpm +LVH No Q waves Pulmonary:     Effort: Pulmonary effort is normal.  Breath sounds: No stridor. No wheezing, rhonchi or rales.  Abdominal:     General: Abdomen is flat. Bowel sounds are normal. There is no distension.     Palpations: Abdomen is soft. There is no hepatomegaly, splenomegaly or mass.     Tenderness: There is no abdominal tenderness.  Musculoskeletal:     Cervical back: Neck supple.     Right lower leg: No edema.     Left lower leg: No edema.  Lymphadenopathy:     Cervical: No cervical adenopathy.  Skin:    General: Skin is warm and dry.  Neurological:     General: No focal  deficit present.     Mental Status: She is alert.  Psychiatric:        Mood and Affect: Mood normal.        Behavior: Behavior normal.    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 For further evaluation of the left breast. EXAM: DIGITAL DIAGNOSTIC UNILATERAL LEFT MAMMOGRAM WITH TOMOSYNTHESIS AND CAD TECHNIQUE: Left digital diagnostic mammography and breast tomosynthesis was performed. The images were evaluated with computer-aided detection. COMPARISON:  Previous exam(s). ACR Breast Density Category c: The breast tissue is heterogeneously dense, which may obscure small masses. FINDINGS: No concerning masses, calcifications or distortion identified within the left breast. IMPRESSION: No mammographic evidence for malignancy. RECOMMENDATION: Continue with surgical consultation for the right breast complex sclerosing lesion. Annual screening mammography in 1 year. I have discussed the findings and recommendations with the patient. If applicable, a reminder letter will be sent to the patient regarding the next appointment. BI-RADS CATEGORY  2: Benign. Electronically Signed   By: Lovey Newcomer M.D.   On: 01/25/2021 16:14    Assessment & Plan:   Melanie Montgomery was seen today for annual exam and hypertension.  Diagnoses and all orders for this visit:  Primary hypertension- Her blood pressure is not adequately  well controlled and she has mild hematuria.  We will evaluate for secondary causes and endorgan damage.  I have asked her to start taking an ARB. -     Aldosterone + renin activity w/ ratio; Future -     Thyroid Panel With TSH; Future -     Urinalysis, Routine w reflex microscopic; Future -     Hepatic function panel; Future -     VITAMIN D 25 Hydroxy (Vit-D Deficiency, Fractures); Future -     Basic metabolic panel; Future -     CBC with Differential/Platelet; Future -     EKG 12-Lead -     CBC with Differential/Platelet -     Basic metabolic panel -     VITAMIN D 25 Hydroxy (Vit-D Deficiency, Fractures) -     Hepatic function panel -     Urinalysis,  Routine w reflex microscopic -     Thyroid Panel With TSH -     Aldosterone + renin activity w/ ratio -     olmesartan (BENICAR) 20 MG tablet; Take 1 tablet (20 mg total) by mouth daily.  Precordial pain- Her EKG is reassuring.  Troponin, D-dimer, and BNP are normal.  This is reassuring that her chest pain is not cardiopulmonary in nature. -     Troponin I (High Sensitivity); Future -     D-dimer, quantitative; Future -     Brain natriuretic peptide; Future -     EKG 12-Lead -     Brain natriuretic peptide -     D-dimer, quantitative -     Troponin I (High Sensitivity) -     ECHOCARDIOGRAM COMPLETE; Future  Encounter for general adult medical examination with abnormal findings- Exam completed, labs reviewed, vaccines reviewed, cancer screenings are up-to-date, patient education was given. -     Lipid panel; Future -     Lipid panel  Hypertensive left ventricular hypertrophy, without heart failure-  I have asked her to undergo an echocardiogram to confirm left ventricular hypertrophy. -     ECHOCARDIOGRAM COMPLETE; Future -     olmesartan (BENICAR) 20 MG tablet; Take 1 tablet (20 mg total) by mouth daily.  I am having Melanie Montgomery start on olmesartan. I am also having her maintain her multivitamin, cyclobenzaprine, diclofenac  sodium, and diazepam.  Meds ordered this encounter  Medications   olmesartan (BENICAR) 20 MG tablet    Sig: Take 1 tablet (20 mg total) by mouth daily.    Dispense:  90 tablet    Refill:  0      Follow-up: Return in about 3 months (around 05/29/2021).  Scarlette Calico, MD

## 2021-02-26 NOTE — Patient Instructions (Signed)

## 2021-02-27 LAB — LIPID PANEL
Cholesterol: 227 mg/dL — ABNORMAL HIGH (ref 0–200)
HDL: 60.7 mg/dL (ref >39.00)
LDL Cholesterol: 143 mg/dL — ABNORMAL HIGH (ref 0–99)
NonHDL: 166.42
Total CHOL/HDL Ratio: 4
Triglycerides: 118 mg/dL (ref 0.0–149.0)
VLDL: 23.6 mg/dL (ref 0.0–40.0)

## 2021-02-27 LAB — CBC WITH DIFFERENTIAL/PLATELET
Basophils Absolute: 0.1 10*3/uL (ref 0.0–0.1)
Basophils Relative: 1.1 % (ref 0.0–3.0)
Eosinophils Absolute: 0.2 10*3/uL (ref 0.0–0.7)
Eosinophils Relative: 1.5 % (ref 0.0–5.0)
HCT: 41.2 % (ref 36.0–46.0)
Hemoglobin: 13.6 g/dL (ref 12.0–15.0)
Lymphocytes Relative: 35.8 % (ref 12.0–46.0)
Lymphs Abs: 3.6 10*3/uL (ref 0.7–4.0)
MCHC: 33 g/dL (ref 30.0–36.0)
MCV: 93.7 fl (ref 78.0–100.0)
Monocytes Absolute: 0.9 10*3/uL (ref 0.1–1.0)
Monocytes Relative: 9.3 % (ref 3.0–12.0)
Neutro Abs: 5.3 10*3/uL (ref 1.4–7.7)
Neutrophils Relative %: 52.3 % (ref 43.0–77.0)
Platelets: 341 10*3/uL (ref 150.0–400.0)
RBC: 4.4 Mil/uL (ref 3.87–5.11)
RDW: 13.1 % (ref 11.5–15.5)
WBC: 10.1 10*3/uL (ref 4.0–10.5)

## 2021-02-27 LAB — URINALYSIS, ROUTINE W REFLEX MICROSCOPIC
Bilirubin Urine: NEGATIVE
Ketones, ur: NEGATIVE
Leukocytes,Ua: NEGATIVE
Nitrite: NEGATIVE
Specific Gravity, Urine: <=1.005 — AB (ref 1.000–1.030)
Total Protein, Urine: NEGATIVE
Urine Glucose: NEGATIVE
Urobilinogen, UA: 0.2 (ref 0.0–1.0)
pH: 6.5 (ref 5.0–8.0)

## 2021-02-27 LAB — VITAMIN D 25 HYDROXY (VIT D DEFICIENCY, FRACTURES): VITD: 20.95 ng/mL — ABNORMAL LOW (ref 30.00–100.00)

## 2021-02-27 LAB — BASIC METABOLIC PANEL
BUN: 19 mg/dL (ref 6–23)
CO2: 31 mEq/L (ref 19–32)
Calcium: 10.5 mg/dL (ref 8.4–10.5)
Chloride: 101 mEq/L (ref 96–112)
Creatinine, Ser: 0.57 mg/dL (ref 0.40–1.20)
GFR: 99.56 mL/min (ref >60.00)
Glucose, Bld: 95 mg/dL (ref 70–99)
Potassium: 4.5 mEq/L (ref 3.5–5.1)
Sodium: 138 mEq/L (ref 135–145)

## 2021-02-27 LAB — TROPONIN I (HIGH SENSITIVITY): High Sens Troponin I: 4 ng/L (ref 2–17)

## 2021-02-27 LAB — HEPATIC FUNCTION PANEL
ALT: 17 U/L (ref 0–35)
AST: 18 U/L (ref 0–37)
Albumin: 4.4 g/dL (ref 3.5–5.2)
Alkaline Phosphatase: 107 U/L (ref 39–117)
Bilirubin, Direct: 0.1 mg/dL (ref 0.0–0.3)
Total Bilirubin: 0.4 mg/dL (ref 0.2–1.2)
Total Protein: 7.5 g/dL (ref 6.0–8.3)

## 2021-02-27 LAB — BRAIN NATRIURETIC PEPTIDE: Pro B Natriuretic peptide (BNP): 21 pg/mL (ref 0.0–100.0)

## 2021-02-27 MED ORDER — OLMESARTAN MEDOXOMIL 20 MG PO TABS: 20.0000 mg | ORAL_TABLET | Freq: Every day | ORAL | 0 refills | Status: DC

## 2021-02-28 ENCOUNTER — Other Ambulatory Visit: Payer: Self-pay | Admitting: General Surgery

## 2021-02-28 DIAGNOSIS — Z9189 Other specified personal risk factors, not elsewhere classified: Secondary | ICD-10-CM

## 2021-03-01 ENCOUNTER — Encounter: Payer: 59 | Admitting: Registered Nurse

## 2021-03-04 LAB — ALDOSTERONE + RENIN ACTIVITY W/ RATIO
ALDO / PRA Ratio: 17.6 Ratio (ref 0.9–28.9)
Aldosterone: 9 ng/dL
Renin Activity: 0.51 ng/mL/h (ref 0.25–5.82)

## 2021-03-04 LAB — THYROID PANEL WITH TSH
Free Thyroxine Index: 1.6 (ref 1.4–3.8)
T3 Uptake: 31 % (ref 22–35)
T4, Total: 5.2 ug/dL (ref 5.1–11.9)
TSH: 2.25 mIU/L (ref 0.40–4.50)

## 2021-03-04 LAB — D-DIMER, QUANTITATIVE: D-Dimer, Quant: 0.3 mcg/mL FEU (ref <0.50)

## 2021-03-14 ENCOUNTER — Other Ambulatory Visit: Payer: Self-pay

## 2021-03-14 ENCOUNTER — Ambulatory Visit
Admission: RE | Admit: 2021-03-14 | Discharge: 2021-03-14 | Disposition: A | Discharge status: Home / Self Care | Payer: 59 | Source: Ambulatory Visit | Attending: General Surgery | Admitting: General Surgery

## 2021-03-14 DIAGNOSIS — Z9189 Other specified personal risk factors, not elsewhere classified: Secondary | ICD-10-CM

## 2021-03-14 MED ORDER — GADOBUTROL 1 MMOL/ML IV SOLN
7.0000 mL | Freq: Once | INTRAVENOUS | Status: AC | PRN
  Administered 2021-03-14: 7 mL via INTRAVENOUS

## 2021-03-15 ENCOUNTER — Telehealth: Payer: Self-pay | Admitting: Genetic Counselor

## 2021-03-15 ENCOUNTER — Ambulatory Visit: Payer: Self-pay | Admitting: Genetic Counselor

## 2021-03-15 ENCOUNTER — Encounter: Payer: Self-pay | Admitting: Genetic Counselor

## 2021-03-15 DIAGNOSIS — Z1379 Encounter for other screening for genetic and chromosomal anomalies: Secondary | ICD-10-CM | POA: Insufficient documentation

## 2021-03-15 NOTE — Telephone Encounter (Signed)
Revealed negative genetic testing. Discussed that we do not know why there is cancer in the family. There could be a genetic mutation in the family that Melanie Montgomery did not inherit. There could also be a mutation in a different gene that we are not testing, or our current technology may not be able to detect certain mutations. It will therefore be important for her to stay in contact with genetics to keep up with whether additional testing may be appropriate in the future.

## 2021-03-15 NOTE — Progress Notes (Signed)
HPI:  Melanie Montgomery was previously seen in the [place] clinic due to a family history of cancer and concerns regarding a hereditary predisposition to cancer. Please refer to our prior cancer genetics clinic note for more information regarding our discussion, assessment and recommendations, at the time. [name] recent genetic test results were disclosed to her, as were recommendations warranted by these results. These results and recommendations are discussed in more detail below.  FAMILY HISTORY:  We obtained a detailed, 4-generation family history.  Significant diagnoses are listed below: Family History  Problem Relation Age of Onset   Cancer Mother    Diabetes Mother    Breast cancer Mother 59   Heart disease Father    Cancer Sister    Diabetes Sister    Colon cancer Sister 59   Diabetes Sister    Heart failure Sister    Diabetes Sister    Diabetes Brother    Heart disease Brother    Heart attack Brother    Heart attack Brother 65   Breast cancer Maternal Grandmother        passed at 37   Cancer Maternal Grandfather        unknown "cauliflower" cancer on his side   Breast cancer Maternal Aunt        dx 98s   Lung cancer Maternal Aunt        hx smoking   Esophageal cancer Maternal Uncle    Stomach cancer Paternal Aunt        dx <50   Brain cancer Paternal Uncle        died 74s   Cirrhosis Paternal Uncle    Breast cancer Cousin        dx 51s (maternal first cousin)   Breast cancer Cousin        dx <50 (maternal first cousin)   Brain cancer Cousin        dx 55s (maternal first cousin)   Cancer Cousin 104       oral cancer (maternal first cousin)   Cancer Cousin 41       unknown cancer (maternal first cousin)   Testicular cancer Nephew 21   Rectal cancer Neg Hx    Melanie Montgomery has three daughters (ages 38-43). She had seven brothers and six sisters. One sister had colon cancer at age 6. This sister's son had testicular cancer at age 80.    Melanie Montgomery mother died at age 92 and had a history of breast cancer diagnosed at age 44. There were four maternal aunts and three maternal uncles. One aunt had breast cancer in her 92s. Another aunt died from lung cancer in her 57s. One uncle died from esophageal cancer at age 60. Several cousins had cancer - one had breast cancer diagnosed younger than 27, another had breast cancer diagnosed in her 73s, a third died from brain cancer in his 74s, a 36 had oral cancer at age 78, and a fifth died from an unknown cancer at age 38. A maternal cousin once removed had an unknown cancer. Melanie Montgomery maternal grandmother died at age 19 with breast cancer (unknown age of diagnosis). Her maternal grandfather died in his 23s with a "cauliflower" type of cancer on his side.    Melanie Montgomery father died at age 38 without cancer. There were two paternal aunts and three paternal uncles. One aunt had stomach cancer diagnosed younger than 47. One uncle died from brain cancer in his 44s. There  is no known cancer among paternal cousins. Melanie Montgomery paternal grandparents died in their 28s without cancer.   Melanie Montgomery is unaware of previous family history of genetic testing for hereditary cancer risks. Patient's maternal ancestors are of [place] and [place] Native American descent, and paternal ancestors are of [place] and [place] Native American descent. There is no reported Ashkenazi Jewish ancestry. There is no known consanguinity.  GENETIC TEST RESULTS: Genetic testing reported out on 03/09/2021 through the [facility] Common Hereditary Cancers + RNA panel. No pathogenic variants were detected.   The Common Hereditary Cancers Panel offered by [facility] includes sequencing and/or deletion duplication testing of the following 47genes: APC*, ATM*, AXIN2*, BARD1*, BMPR1A*, BRCA1*, BRCA2*, BRIP1*, CDH1*, CDK4, CDKN2A (p14ARF), CDKN2A (p16INK4a), CHEK2*, CTNNA1*, DICER1*, EPCAM (Deletion/duplication testing  only), GREM1 (promoter region deletion/duplication testing only), KIT, MEN1*, MLH1*, MSH2*, MSH3*, MSH6*, MUTYH*, NBN*, NF1*, NTHL1, PALB2*, PDGFRA, PMS2*, POLD1*, POLE*, PTEN*, RAD50*, RAD51C*, RAD51D*, SDHB*, SDHC*, SDHD*, SMAD4*, SMARCA4*, STK11*, TP53*, TSC1*, TSC2*, and VHL*.  The following genes were evaluated for sequence changes only: SDHA* and HOXB13 c.251G>A variant only. RNA analysis performed for * genes. The test report will be scanned into EPIC and located under the Molecular Pathology section of the Results Review tab.  A portion of the result report is included below for reference.     We discussed with [name] that because current genetic testing is not perfect, it is possible there may be a gene mutation in one of these genes that current testing cannot detect, but that chance is small.  We also discussed that there could be another gene that has not yet been discovered, or that we have not yet tested, that is responsible for the cancer diagnoses in the family. It is also possible there is a hereditary cause for the cancer in the family that Melanie Montgomery did not inherit and therefore was not identified in her testing.  Therefore, it is important to remain in touch with cancer genetics in the future so that we can continue to offer [name] the most up to date genetic testing.   CANCER SCREENING RECOMMENDATIONS: Melanie Montgomery test result is considered negative (normal).  This means that we have not identified a hereditary cause for her family history of cancer at this time. While reassuring, this does not definitively rule out a hereditary predisposition to cancer. It is still possible that there could be genetic mutations that are undetectable by current technology. There could be genetic mutations in genes that have not been tested or identified to increase cancer risk.  Therefore, it is recommended she continue to follow the cancer management and screening guidelines  provided by her primary healthcare provider.   An individual's cancer risk and medical management are not determined by genetic test results alone. Overall cancer risk assessment incorporates additional factors, including personal medical history, family history, and any available genetic information that may result in a personalized plan for cancer prevention and surveillance.  Breast Cancer Risk: Based on Ms. Riggio's personal and family history, as well as her genetic test results, the [place] was used to estimate her risk of developing breast cancer. Tyrer-Cuzick estimates her lifetime risk of developing breast cancer to be approximately 23.6%. This lifetime breast cancer risk is a preliminary estimate based on available information using one of several models endorsed by the [facility] (ACS). The ACS recommends consideration of breast MRI screening as an adjunct to mammography for patients at high risk (defined as 20% or  greater lifetime risk). A more detailed breast cancer risk assessment can be considered, if clinically indicated. This risk estimate can change over time and that this calculation may be repeated to reflect new information in her personal or family history in the future.   Ms. Homer has been determined to be at high risk for breast cancer. Therefore, we recommend that she proceed with the following breast cancer screening guidelines (per NCCN Breast Cancer Screening and Guidelines Version 1.2022): Clinical encounter every 6-12 months - to begin when identified as being at increased risk, but not prior to age 8y Annual screening mammogram, consider tomosynthesis - to begin 10 years prior to when the youngest family member was diagnosed with breast cancer, not prior to age 35y, or age 56y (whichever comes first) Annual breast MRI - to begin 10 years prior to when the youngest family member was diagnosed with breast cancer, not prior to age  27y, or age 50y (whichever comes first) Consider risk reduction strategies  We discussed that Ms. Langhans should discuss her individual situation with her referring physician and determine a breast cancer screening plan with which they are both comfortable.       Colon Cancer Risk:  We also discussed that Ms. Deboard likely has an increased risk to develop colorectal cancer, given the family history of colon cancer in her sister at age 88. Per the NCCN Guidelines (Colorectal Cancer Screening Guidelines, Version 1.2022), individuals with a first-degree relative who has had colon cancer should have a colonoscopy every 5 years, or as determined by their GI doctors, beginning at age 69 (or 19 years younger than the earliest diagnosis, whichever is first).  RECOMMENDATIONS FOR FAMILY MEMBERS:  Individuals in this family might be at some increased risk of developing cancer, over the general population risk, simply due to the family history of cancer.  We recommended women in this family have a yearly mammogram beginning at age 1, or 59 years younger than the earliest onset of cancer, an annual clinical breast exam, and perform monthly breast self-exams. Women in this family should also have a gynecological exam as recommended by their primary provider. All family members should be referred for colonoscopy starting at age 67.  It is also possible there is a hereditary cause for the cancer in Ms. Causey's family that she did not inherit and therefore was not identified in her.  Based on Ms. Chimenti's family history, we recommended her siblings and her maternal cousins who have had breast cancer have genetic counseling and testing. Ms. Masse will let us know if we can be of any assistance in coordinating genetic counseling and/or testing for these family members.   FOLLOW-UP: Lastly, we discussed with Ms. Fullen that cancer genetics is a rapidly advancing field and it is possible that new  genetic tests will be appropriate for her and/or her family members in the future. We encouraged her to remain in contact with cancer genetics on an annual basis so we can update her personal and family histories and let her know of advances in cancer genetics that may benefit this family.   Our contact number was provided. Ms. Mcnear questions were answered to her satisfaction, and she knows she is welcome to call us at anytime with additional questions or concerns.   Clint Guy, MS, [facility] Genetic Counselor [email or web address] Phone: [phone]

## 2021-03-28 ENCOUNTER — Other Ambulatory Visit: Payer: Self-pay | Admitting: General Surgery

## 2021-03-28 DIAGNOSIS — R928 Other abnormal and inconclusive findings on diagnostic imaging of breast: Secondary | ICD-10-CM

## 2021-03-29 ENCOUNTER — Other Ambulatory Visit: Payer: Self-pay | Admitting: General Surgery

## 2021-03-29 DIAGNOSIS — R928 Other abnormal and inconclusive findings on diagnostic imaging of breast: Secondary | ICD-10-CM

## 2021-04-09 ENCOUNTER — Telehealth: Payer: Self-pay | Admitting: Genetic Counselor

## 2021-04-09 NOTE — Telephone Encounter (Signed)
Melanie Montgomery called with concerns regarding insurance coverage and OOP cost for her genetic testing. We will confirm with the genetic testing laboratory what her OOP cost will be and will let her know once we have this information.

## 2021-04-17 ENCOUNTER — Other Ambulatory Visit: Payer: Self-pay

## 2021-04-17 ENCOUNTER — Encounter (HOSPITAL_BASED_OUTPATIENT_CLINIC_OR_DEPARTMENT_OTHER): Payer: Self-pay | Admitting: General Surgery

## 2021-04-18 ENCOUNTER — Other Ambulatory Visit: Payer: Self-pay | Admitting: General Surgery

## 2021-04-18 DIAGNOSIS — R928 Other abnormal and inconclusive findings on diagnostic imaging of breast: Secondary | ICD-10-CM

## 2021-04-22 ENCOUNTER — Other Ambulatory Visit: Payer: Self-pay | Admitting: General Surgery

## 2021-04-23 ENCOUNTER — Other Ambulatory Visit: Payer: Self-pay | Admitting: General Surgery

## 2021-04-23 DIAGNOSIS — R928 Other abnormal and inconclusive findings on diagnostic imaging of breast: Secondary | ICD-10-CM

## 2021-04-24 ENCOUNTER — Ambulatory Visit
Admission: RE | Admit: 2021-04-24 | Discharge: 2021-04-24 | Disposition: A | Discharge status: Home / Self Care | Payer: 59 | Source: Ambulatory Visit | Attending: General Surgery | Admitting: General Surgery

## 2021-04-24 ENCOUNTER — Other Ambulatory Visit: Payer: Self-pay

## 2021-04-24 DIAGNOSIS — R928 Other abnormal and inconclusive findings on diagnostic imaging of breast: Secondary | ICD-10-CM

## 2021-04-24 NOTE — Progress Notes (Signed)

## 2021-04-25 ENCOUNTER — Ambulatory Visit (HOSPITAL_BASED_OUTPATIENT_CLINIC_OR_DEPARTMENT_OTHER): Payer: 59 | Admitting: Anesthesiology

## 2021-04-25 ENCOUNTER — Encounter (HOSPITAL_BASED_OUTPATIENT_CLINIC_OR_DEPARTMENT_OTHER): Payer: Self-pay | Admitting: General Surgery

## 2021-04-25 ENCOUNTER — Ambulatory Visit
Admission: RE | Admit: 2021-04-25 | Discharge: 2021-04-25 | Disposition: A | Discharge status: Home / Self Care | Payer: 59 | Source: Ambulatory Visit | Attending: General Surgery | Admitting: General Surgery

## 2021-04-25 ENCOUNTER — Encounter (HOSPITAL_BASED_OUTPATIENT_CLINIC_OR_DEPARTMENT_OTHER)
Admission: RE | Disposition: A | Discharge status: Home / Self Care | Payer: Self-pay | Source: Home / Self Care | Attending: General Surgery

## 2021-04-25 ENCOUNTER — Ambulatory Visit (HOSPITAL_BASED_OUTPATIENT_CLINIC_OR_DEPARTMENT_OTHER)
Admission: RE | Admit: 2021-04-25 | Discharge: 2021-04-25 | Disposition: A | Discharge status: Home / Self Care | Payer: 59 | Attending: General Surgery | Admitting: General Surgery

## 2021-04-25 ENCOUNTER — Other Ambulatory Visit: Payer: Self-pay

## 2021-04-25 DIAGNOSIS — N6081 Other benign mammary dysplasias of right breast: Secondary | ICD-10-CM | POA: Insufficient documentation

## 2021-04-25 DIAGNOSIS — N6489 Other specified disorders of breast: Secondary | ICD-10-CM | POA: Insufficient documentation

## 2021-04-25 DIAGNOSIS — R928 Other abnormal and inconclusive findings on diagnostic imaging of breast: Secondary | ICD-10-CM

## 2021-04-25 DIAGNOSIS — Z803 Family history of malignant neoplasm of breast: Secondary | ICD-10-CM | POA: Insufficient documentation

## 2021-04-25 DIAGNOSIS — Z87891 Personal history of nicotine dependence: Secondary | ICD-10-CM | POA: Insufficient documentation

## 2021-04-25 DIAGNOSIS — R921 Mammographic calcification found on diagnostic imaging of breast: Secondary | ICD-10-CM | POA: Diagnosis not present

## 2021-04-25 HISTORY — DX: Hypoglycemia, unspecified: E16.2

## 2021-04-25 HISTORY — PX: RADIOACTIVE SEED GUIDED EXCISIONAL BREAST BIOPSY: SHX6490

## 2021-04-25 HISTORY — DX: Essential (primary) hypertension: I10

## 2021-04-25 LAB — GLUCOSE, CAPILLARY
Glucose-Capillary: 58 mg/dL — ABNORMAL LOW (ref 70–99)
Glucose-Capillary: 162 mg/dL — ABNORMAL HIGH (ref 70–99)
Glucose-Capillary: 116 mg/dL — ABNORMAL HIGH (ref 70–99)

## 2021-04-25 SURGERY — RADIOACTIVE SEED GUIDED BREAST BIOPSY
Anesthesia: General | Site: Breast | Laterality: Right

## 2021-04-25 MED ORDER — HYDROMORPHONE HCL 1 MG/ML IJ SOLN: 0.2500 mg | INTRAMUSCULAR | Status: DC | PRN

## 2021-04-25 MED ORDER — OXYCODONE HCL 5 MG/5ML PO SOLN
5.0000 mg | Freq: Once | ORAL | Status: DC | PRN
Start: 2021-04-25 — End: 2021-04-25

## 2021-04-25 MED ORDER — FENTANYL CITRATE (PF) 100 MCG/2ML IJ SOLN
INTRAMUSCULAR | Status: DC | PRN
  Administered 2021-04-25 (×2): 50 ug via INTRAVENOUS

## 2021-04-25 MED ORDER — ACETAMINOPHEN 500 MG PO TABS
ORAL_TABLET | ORAL | Status: AC
  Filled 2021-04-25: qty 2

## 2021-04-25 MED ORDER — DEXTROSE 50 % IV SOLN
INTRAVENOUS | Status: AC
  Filled 2021-04-25: qty 50

## 2021-04-25 MED ORDER — MIDAZOLAM HCL 2 MG/2ML IJ SOLN
INTRAMUSCULAR | Status: AC
  Filled 2021-04-25: qty 2

## 2021-04-25 MED ORDER — MEPERIDINE HCL 25 MG/ML IJ SOLN: 6.2500 mg | INTRAMUSCULAR | Status: DC | PRN

## 2021-04-25 MED ORDER — CHLORHEXIDINE GLUCONATE CLOTH 2 % EX PADS: 6.0000 | MEDICATED_PAD | Freq: Once | CUTANEOUS | Status: DC

## 2021-04-25 MED ORDER — LIDOCAINE HCL (CARDIAC) PF 100 MG/5ML IV SOSY
PREFILLED_SYRINGE | INTRAVENOUS | Status: DC | PRN
  Administered 2021-04-25: 60 mg via INTRAVENOUS

## 2021-04-25 MED ORDER — DEXAMETHASONE SODIUM PHOSPHATE 10 MG/ML IJ SOLN
INTRAMUSCULAR | Status: AC
  Filled 2021-04-25: qty 1

## 2021-04-25 MED ORDER — AMISULPRIDE (ANTIEMETIC) 5 MG/2ML IV SOLN: 10.0000 mg | Freq: Once | INTRAVENOUS | Status: DC | PRN

## 2021-04-25 MED ORDER — LIDOCAINE HCL 1 % IJ SOLN
INTRAMUSCULAR | Status: DC | PRN
  Administered 2021-04-25: 30 mL

## 2021-04-25 MED ORDER — LACTATED RINGERS IV SOLN: INTRAVENOUS | Status: DC

## 2021-04-25 MED ORDER — ONDANSETRON HCL 4 MG/2ML IJ SOLN
INTRAMUSCULAR | Status: AC
  Filled 2021-04-25: qty 2

## 2021-04-25 MED ORDER — FENTANYL CITRATE (PF) 100 MCG/2ML IJ SOLN
INTRAMUSCULAR | Status: AC
  Filled 2021-04-25: qty 2

## 2021-04-25 MED ORDER — EPHEDRINE 5 MG/ML INJ
INTRAVENOUS | Status: AC
  Filled 2021-04-25: qty 10

## 2021-04-25 MED ORDER — DEXAMETHASONE SODIUM PHOSPHATE 4 MG/ML IJ SOLN
INTRAMUSCULAR | Status: DC | PRN
  Administered 2021-04-25: 5 mg via INTRAVENOUS

## 2021-04-25 MED ORDER — CEFAZOLIN SODIUM-DEXTROSE 2-4 GM/100ML-% IV SOLN
2.0000 g | INTRAVENOUS | Status: AC
  Administered 2021-04-25: 2 g via INTRAVENOUS

## 2021-04-25 MED ORDER — SUCCINYLCHOLINE CHLORIDE 200 MG/10ML IV SOSY
PREFILLED_SYRINGE | INTRAVENOUS | Status: AC
  Filled 2021-04-25: qty 10

## 2021-04-25 MED ORDER — PROPOFOL 10 MG/ML IV BOLUS
INTRAVENOUS | Status: DC | PRN
  Administered 2021-04-25: 200 mg via INTRAVENOUS

## 2021-04-25 MED ORDER — PHENYLEPHRINE 40 MCG/ML (10ML) SYRINGE FOR IV PUSH (FOR BLOOD PRESSURE SUPPORT)
PREFILLED_SYRINGE | INTRAVENOUS | Status: AC
  Filled 2021-04-25: qty 10

## 2021-04-25 MED ORDER — OXYCODONE HCL 5 MG PO TABS: 5.0000 mg | ORAL_TABLET | Freq: Once | ORAL | Status: DC | PRN

## 2021-04-25 MED ORDER — OXYCODONE HCL 5 MG PO TABS: 5.0000 mg | ORAL_TABLET | Freq: Four times a day (QID) | ORAL | 0 refills | Status: DC | PRN

## 2021-04-25 MED ORDER — DEXTROSE 50 % IV SOLN
25.0000 mL | Freq: Once | INTRAVENOUS | Status: AC
  Administered 2021-04-25: 25 mL via INTRAVENOUS

## 2021-04-25 MED ORDER — CEFAZOLIN SODIUM-DEXTROSE 2-4 GM/100ML-% IV SOLN
INTRAVENOUS | Status: AC
  Filled 2021-04-25: qty 100

## 2021-04-25 MED ORDER — MIDAZOLAM HCL 5 MG/5ML IJ SOLN
INTRAMUSCULAR | Status: DC | PRN
  Administered 2021-04-25: 2 mg via INTRAVENOUS

## 2021-04-25 MED ORDER — ONDANSETRON HCL 4 MG/2ML IJ SOLN
INTRAMUSCULAR | Status: DC | PRN
  Administered 2021-04-25: 4 mg via INTRAVENOUS

## 2021-04-25 MED ORDER — ACETAMINOPHEN 500 MG PO TABS
1000.0000 mg | ORAL_TABLET | ORAL | Status: AC
  Administered 2021-04-25: 1000 mg via ORAL

## 2021-04-25 MED ORDER — PROMETHAZINE HCL 25 MG/ML IJ SOLN: 6.2500 mg | INTRAMUSCULAR | Status: DC | PRN

## 2021-04-25 MED ORDER — ATROPINE SULFATE 0.4 MG/ML IJ SOLN
INTRAMUSCULAR | Status: AC
  Filled 2021-04-25: qty 1

## 2021-04-25 SURGICAL SUPPLY — 49 items
ADH SKN CLS APL DERMABOND .7 (GAUZE/BANDAGES/DRESSINGS) ×1
APL PRP STRL LF DISP 70% ISPRP (MISCELLANEOUS) ×1
BINDER BREAST LRG (GAUZE/BANDAGES/DRESSINGS) ×2 IMPLANT
BLADE SURG 10 STRL SS (BLADE) ×2 IMPLANT
BLADE SURG 15 STRL LF DISP TIS (BLADE) IMPLANT
BLADE SURG 15 STRL SS (BLADE)
CANISTER SUC SOCK COL 7IN (MISCELLANEOUS) IMPLANT
CANISTER SUCT 1200ML W/VALVE (MISCELLANEOUS) ×2 IMPLANT
CHLORAPREP W/TINT 26 (MISCELLANEOUS) ×2 IMPLANT
CLIP TI LARGE 6 (CLIP) ×2 IMPLANT
COVER BACK TABLE 60X90IN (DRAPES) ×2 IMPLANT
COVER MAYO STAND STRL (DRAPES) ×2 IMPLANT
COVER PROBE W GEL 5X96 (DRAPES) ×2 IMPLANT
DECANTER SPIKE VIAL GLASS SM (MISCELLANEOUS) IMPLANT
DERMABOND ADVANCED (GAUZE/BANDAGES/DRESSINGS) ×1
DERMABOND ADVANCED .7 DNX12 (GAUZE/BANDAGES/DRESSINGS) ×1 IMPLANT
DRAPE LAPAROSCOPIC ABDOMINAL (DRAPES) ×2 IMPLANT
DRAPE UTILITY XL STRL (DRAPES) ×2 IMPLANT
ELECT COATED BLADE 2.86 ST (ELECTRODE) ×2 IMPLANT
ELECT REM PT RETURN 9FT ADLT (ELECTROSURGICAL) ×2
ELECTRODE REM PT RTRN 9FT ADLT (ELECTROSURGICAL) ×1 IMPLANT
GAUZE SPONGE 4X4 12PLY STRL LF (GAUZE/BANDAGES/DRESSINGS) ×2 IMPLANT
GLOVE SURG ENC MOIS LTX SZ6 (GLOVE) ×2 IMPLANT
GLOVE SURG UNDER POLY LF SZ6.5 (GLOVE) ×2 IMPLANT
GOWN STRL REUS W/ TWL LRG LVL3 (GOWN DISPOSABLE) ×1 IMPLANT
GOWN STRL REUS W/TWL 2XL LVL3 (GOWN DISPOSABLE) ×2 IMPLANT
GOWN STRL REUS W/TWL LRG LVL3 (GOWN DISPOSABLE) ×2
KIT MARKER MARGIN INK (KITS) ×2 IMPLANT
LIGHT WAVEGUIDE WIDE FLAT (MISCELLANEOUS) IMPLANT
NEEDLE HYPO 25X1 1.5 SAFETY (NEEDLE) ×2 IMPLANT
NS IRRIG 1000ML POUR BTL (IV SOLUTION) ×2 IMPLANT
PACK BASIN DAY SURGERY FS (CUSTOM PROCEDURE TRAY) ×2 IMPLANT
PENCIL SMOKE EVACUATOR (MISCELLANEOUS) ×2 IMPLANT
SLEEVE SCD COMPRESS KNEE MED (STOCKING) ×2 IMPLANT
SPONGE T-LAP 18X18 ~~LOC~~+RFID (SPONGE) ×2 IMPLANT
STAPLER VISISTAT 35W (STAPLE) ×2 IMPLANT
STRIP CLOSURE SKIN 1/2X4 (GAUZE/BANDAGES/DRESSINGS) ×2 IMPLANT
SUT MON AB 4-0 PC3 18 (SUTURE) ×2 IMPLANT
SUT SILK 2 0 SH (SUTURE) IMPLANT
SUT VIC AB 2-0 SH 18 (SUTURE) IMPLANT
SUT VIC AB 3-0 SH 27 (SUTURE) ×2
SUT VIC AB 3-0 SH 27X BRD (SUTURE) ×1 IMPLANT
SUT VICRYL 3-0 CR8 SH (SUTURE) IMPLANT
SYR BULB EAR ULCER 3OZ GRN STR (SYRINGE) ×2 IMPLANT
SYR CONTROL 10ML LL (SYRINGE) ×2 IMPLANT
TOWEL GREEN STERILE FF (TOWEL DISPOSABLE) ×2 IMPLANT
TRAY FAXITRON CT DISP (TRAY / TRAY PROCEDURE) ×2 IMPLANT
TUBE CONNECTING 20X1/4 (TUBING) ×2 IMPLANT
YANKAUER SUCT BULB TIP NO VENT (SUCTIONS) ×2 IMPLANT

## 2021-04-25 NOTE — H&P (Signed)
Melanie Montgomery Location: [facility] Patient #: [id number] DOB: 18-May-1961 Divorced / Language: Melanie Montgomery / Race: White Female   History of Present Illness  The patient is a 60 year old female who presents with a complaint of Breast problems.Pt is an 60 yo F referred by Dr. Theda Montgomery for an abnormal right mammogram.  She had screening detected calcifications.  dx imaging showed grouped punctate calcifications in the retroareolar right breast, 5 mm.  She subsequently had core needle biopsy that showed a complex sclerosing lesion.  She is referred to discuss excisional biopsy.  The patient hasn't had any cancer dx.  She has significant family history of cancer.  Her maternal grandmother had breast cancer age 53, mother at age 49, maternal aunt in her 40s, and a maternal cousin in her 62s as well.     diagnostic mammogram 01/16/2021 EXAM: DIGITAL DIAGNOSTIC UNILATERAL RIGHT MAMMOGRAM WITH TOMOSYNTHESIS AND CAD  TECHNIQUE: Right digital diagnostic mammography and breast tomosynthesis was performed. The images were evaluated with computer-aided detection.  COMPARISON:  Previous exam(s).  ACR Breast Density Category b: There are scattered areas of fibroglandular density.  FINDINGS: There are grouped punctate and amorphous calcifications within the retroareolar RIGHT breast, anterior depth, measuring 5 mm extent.  IMPRESSION: Grouped punctate and amorphous calcifications within the retroareolar RIGHT breast, at anterior depth, measuring 5 mm extent. These is a suspicious finding for which stereotactic biopsy is recommended.  RECOMMENDATION: Stereotactic biopsy for the RIGHT breast calcifications.  Stereotactic biopsy is scheduled for May 13th.  I have discussed the findings and recommendations with the patient. If applicable, a reminder letter will be sent to the patient regarding the next appointment.  BI-RADS CATEGORY  4: Suspicious.   pathology 01/18/21 1. Breast,  right, needle core biopsy, lower outer, middle depth - COMPLEX SCLEROSING LESION WITH CALCIFICATIONS - SEE COMMENT 2. Breast, right, needle core biopsy, lower outer, anterior depth - USUAL DUCTAL HYPERPLASIA AND FIBROCYSTIC CHANGES WITH APOCRINE METAPLASIA - CALCIFICATIONS - SEE   Past Surgical History Cesarean Section - Multiple    Allergies No Known Drug Allergies    Allergies Reconciled    Medication History  diazePAM  ('2MG'$  Tablet, Oral) Active. Doxycycline Hyclate  ('100MG'$  Tablet, Oral) Active. Estradiol  (0.'1MG'$ /GM Cream, Vaginal) Active. Medications Reconciled   Social History  Caffeine use   Carbonated beverages. Tobacco use   Former smoker.  Family History  Alcohol Abuse   Father. Hypertension   Brother.  Other Problems  Anxiety Disorder   Depression   Lump In Breast      Review of Systems  General Not Present- Appetite Loss, Chills, Fatigue, Fever, Night Sweats, Weight Gain and Weight Loss. HEENT Present- Seasonal Allergies. Not Present- Earache, Hearing Loss, Hoarseness, Nose Bleed, Oral Ulcers, Ringing in the Ears, Sinus Pain, Sore Throat, Visual Disturbances, Wears glasses/contact lenses and Yellow Eyes. Respiratory Not Present- Bloody sputum, Chronic Cough, Difficulty Breathing, Snoring and Wheezing. Cardiovascular Present- Leg Cramps and Swelling of Extremities. Not Present- Chest Pain, Difficulty Breathing Lying Down, Palpitations, Rapid Heart Rate and Shortness of Breath. Female Genitourinary Present- Frequency. Not Present- Nocturia, Painful Urination, Pelvic Pain and Urgency.  Vitals  Weight: 156.25 lb   Height: 62 in  Body Surface Area: 1.72 m   Body Mass Index: 28.58 kg/m   Temp.: 98.7 F    Pulse: 81 (Regular)    P.OX: 97% (Room air) BP: 160/80(Sitting, Left Arm, Standard)       Physical Exam  General Mental Status - Alert. General Appearance -  Consistent with stated age. Hydration - Well hydrated. Voice - Normal.  Head and Neck Head  - normocephalic, atraumatic with no lesions or palpable masses. Trachea - midline. Thyroid Gland Characteristics - normal size and consistency.  Eye Eyeball - Bilateral - Extraocular movements intact. Sclera/Conjunctiva - Bilateral - No scleral icterus.  Chest and Lung Exam Chest and lung exam reveals  - quiet, even and easy respiratory effort with no use of accessory muscles and on auscultation, normal breath sounds, no adventitious sounds and normal vocal resonance. Inspection Chest Wall - Normal. Back - normal.  Breast Note:  breasts relatively symmetric. No palpable masses. no skin dimpling. no nipple retraction or nipple discharge. No LAD   Cardiovascular Cardiovascular examination reveals  - normal heart sounds, regular rate and rhythm with no murmurs and normal pedal pulses bilaterally.  Abdomen Inspection Inspection of the abdomen reveals - No Hernias. Palpation/Percussion Palpation and Percussion of the abdomen reveal - Soft, Non Tender, No Rebound tenderness, No Rigidity (guarding) and No hepatosplenomegaly. Auscultation Auscultation of the abdomen reveals - Bowel sounds normal.  Neurologic Neurologic evaluation reveals  - alert and oriented x 3 with no impairment of recent or remote memory. Mental Status - Normal.  Musculoskeletal Global Assessment  - Note:  no gross deformities.  Normal Exam - Left - Upper Extremity Strength Normal and Lower Extremity Strength Normal. Normal Exam - Right - Upper Extremity Strength Normal and Lower Extremity Strength Normal.  Lymphatic Head & Neck  General Head & Neck Lymphatics: Bilateral - Description - Normal. Axillary  General Axillary Region: Bilateral - Description - Normal. Tenderness - Non Tender. Femoral & Inguinal  Generalized Femoral & Inguinal Lymphatics: Bilateral - Description - No Generalized lymphadenopathy.    Assessment & Plan  ABNORMAL MAMMOGRAM OF RIGHT BREAST (R92.8) Impression: Pt will need  excision of the CSL. Will get MR first to make sure nothing else needs to be excised. This will require a seed.  The surgical procedure was described to the patient. I discussed the incision type and location and that we would need radiology involved on with a wire or seed marker and/or sentinel node.  The risks and benefits of the procedure were described to the patient and she wishes to proceed.  We discussed the risks bleeding, infection, damage to other structures, need for further procedures/surgeries. We discussed the risk of seroma. The patient was advised if the area in the breast in cancer, we may need to go back to surgery for additional tissue to obtain negative margins or for a lymph node biopsy. The patient was advised that these are the most common complications, but that others can occur as well. They were advised against taking aspirin or other anti-inflammatory agents/blood thinners the week before surgery. Current Plans Pt Education - [facility] Breast Biopsy HCI: discussed with patient and provided information. You are being scheduled for surgery - Our schedulers will call you.   You should hear from our office's scheduling department within 5 working days about the location, date, and time of surgery.  We try to make accommodations for patient's preferences in scheduling surgery, but sometimes the OR schedule or the surgeon's schedule prevents [place] from making those accommodations.  If you have not heard from our office [phone]) in 5 working days, call the office and ask for your surgeon's nurse.  If you have other questions about your diagnosis, plan, or surgery, call the office and ask for your surgeon's nurse.  AT HIGH RISK FOR BREAST CANCER (Z91.89)  Impression: Will get MRI based on high risk for breast cancer Current Plans MR BREAST BILAT WO/W CON QE:[id number]) (Pt with 31% risk of breast cancer based on 5 family members with breast cancer) FAMILY HISTORY OF BREAST CANCER  (Z80.3) Impression: genetics is already pending. It should be back by the end of the month.

## 2021-04-25 NOTE — Discharge Instructions (Addendum)
[facility] Office Phone Number [phone]  BREAST BIOPSY/ PARTIAL MASTECTOMY: POST OP INSTRUCTIONS  Always review your discharge instruction sheet given to you by the facility where your surgery was performed.  IF YOU HAVE DISABILITY OR FAMILY LEAVE FORMS, YOU MUST BRING THEM TO THE OFFICE FOR PROCESSING.  DO NOT GIVE THEM TO YOUR DOCTOR.  A prescription for pain medication may be given to you upon discharge.  Take your pain medication as prescribed, if needed.  If narcotic pain medicine is not needed, then you may take acetaminophen (Tylenol) or ibuprofen (Advil) as needed. Take your usually prescribed medications unless otherwise directed If you need a refill on your pain medication, please contact your pharmacy.  They will contact our office to request authorization.  Prescriptions will not be filled after 5pm or on week-ends. You should eat very light the first 24 hours after surgery, such as soup, crackers, pudding, etc.  Resume your normal diet the day after surgery. Most patients will experience some swelling and bruising in the breast.  Ice packs and a good support bra will help.  Swelling and bruising can take several days to resolve.  It is common to experience some constipation if taking pain medication after surgery.  Increasing fluid intake and taking a stool softener will usually help or prevent this problem from occurring.  A mild laxative (Milk of Magnesia or Miralax) should be taken according to package directions if there are no bowel movements after 48 hours. Unless discharge instructions indicate otherwise, you may remove your bandages 48 hours after surgery, and you may shower at that time.  You may have steri-strips (small skin tapes) in place directly over the incision.  These strips should be left on the skin for 7-10 days.   Any sutures or staples will be removed at the office during your follow-up visit. ACTIVITIES:  You may resume regular daily activities  (gradually increasing) beginning the next day.  Wearing a good support bra or sports bra (or the breast binder) minimizes pain and swelling.  You may have sexual intercourse when it is comfortable. You may drive when you no longer are taking prescription pain medication, you can comfortably wear a seatbelt, and you can safely maneuver your car and apply brakes. RETURN TO WORK:  __________1 week_______________ Dennis Bast should see your doctor in the office for a follow-up appointment approximately two weeks after your surgery.  Your doctor's nurse will typically make your follow-up appointment when she calls you with your pathology report.  Expect your pathology report 2-3 business days after your surgery.  You may call to check if you do not hear from [place] after three days.   WHEN TO CALL YOUR DOCTOR: Fever over 101.0 Nausea and/or vomiting. Extreme swelling or bruising. Continued bleeding from incision. Increased pain, redness, or drainage from the incision.  The clinic staff is available to answer your questions during regular business hours.  Please don't hesitate to call and ask to speak to one of the nurses for clinical concerns.  If you have a medical emergency, go to the nearest emergency room or call 911.  A surgeon from [facility] is always on call at the hospital.  For further questions, please visit [email or web address]   No Tylenol until 6:09 pm   Post Anesthesia Home Care Instructions  Activity: Get plenty of rest for the remainder of the day. A responsible individual must stay with you for 24 hours following the procedure.  For the next 24 hours,  DO NOT: -Drive a car -Paediatric nurse -Drink alcoholic beverages -Take any medication unless instructed by your physician -Make any legal decisions or sign important papers.  Meals: Start with liquid foods such as gelatin or soup. Progress to regular foods as tolerated. Avoid greasy, spicy, heavy foods. If nausea  and/or vomiting occur, drink only clear liquids until the nausea and/or vomiting subsides. Call your physician if vomiting continues.  Special Instructions/Symptoms: Your throat may feel dry or sore from the anesthesia or the breathing tube placed in your throat during surgery. If this causes discomfort, gargle with warm salt water. The discomfort should disappear within 24 hours.  If you had a scopolamine patch placed behind your ear for the management of post- operative nausea and/or vomiting:  1. The medication in the patch is effective for 72 hours, after which it should be removed.  Wrap patch in a tissue and discard in the trash. Wash hands thoroughly with soap and water. 2. You may remove the patch earlier than 72 hours if you experience unpleasant side effects which may include dry mouth, dizziness or visual disturbances. 3. Avoid touching the patch. Wash your hands with soap and water after contact with the patch.

## 2021-04-25 NOTE — Transfer of Care (Signed)
Immediate Anesthesia Transfer of Care Note  Patient: Melanie Montgomery  Procedure(s) Performed: RADIOACTIVE SEED GUIDED EXCISIONAL RIGHT BREAST BIOPSY (Right: Breast)  Patient Location: PACU  Anesthesia Type:General  Level of Consciousness: awake, alert  and oriented  Airway & Oxygen Therapy: Patient Spontanous Breathing and Patient connected to face mask oxygen  Post-op Assessment: Report given to RN and Post -op Vital signs reviewed and stable  Post vital signs: Reviewed and stable  Last Vitals:  Vitals Value Taken Time  BP    Temp    Pulse 75 04/25/21 1423  Resp 23 04/25/21 1423  SpO2 97 % 04/25/21 1423  Vitals shown include unvalidated device data.  Last Pain:  Vitals:   04/25/21 1203  TempSrc: Oral  PainSc: 0-No pain      Patients Stated Pain Goal: 4 ([id number] [phone]  Complications: No notable events documented.

## 2021-04-25 NOTE — Anesthesia Postprocedure Evaluation (Signed)
Anesthesia Post Note  Patient: Onalee Steinmeyer [facility]  Procedure(s) Performed: RADIOACTIVE SEED GUIDED EXCISIONAL RIGHT BREAST BIOPSY (Right: Breast)     Patient location during evaluation: PACU Anesthesia Type: General Level of consciousness: awake and alert Pain management: pain level controlled Vital Signs Assessment: post-procedure vital signs reviewed and stable Respiratory status: spontaneous breathing, nonlabored ventilation and respiratory function stable Cardiovascular status: blood pressure returned to baseline and stable Postop Assessment: no apparent nausea or vomiting Anesthetic complications: no   No notable events documented.  Last Vitals:  Vitals:   04/25/21 1445 04/25/21 1511  BP: (!) 149/81 (!) 152/76  Pulse: 74 72  Resp: 20 18  Temp:  36.5 C  SpO2: 96% 95%    Last Pain:  Vitals:   04/25/21 1511  TempSrc:   PainSc: 0-No pain                 Lynda Rainwater

## 2021-04-25 NOTE — Interval H&P Note (Signed)
History and Physical Interval Note:  04/25/2021 1:03 PM  Melanie Montgomery [facility]  has presented today for surgery, with the diagnosis of ABNORMAL RIGHT MAMMOGRAM.  The various methods of treatment have been discussed with the patient and family. After consideration of risks, benefits and other options for treatment, the patient has consented to  Procedure(s): RADIOACTIVE SEED GUIDED EXCISIONAL RIGHT BREAST BIOPSY (Right) as a surgical intervention.  The patient's history has been reviewed, patient examined, no change in status, stable for surgery.  I have reviewed the patient's chart and labs.  Questions were answered to the patient's satisfaction.     Stark Klein

## 2021-04-25 NOTE — Anesthesia Procedure Notes (Signed)
Procedure Name: LMA Insertion Date/Time: 04/25/2021 1:24 PM Performed by: Willa Frater, CRNA Pre-anesthesia Checklist: Patient identified, Emergency Drugs available, Suction available and Patient being monitored Patient Re-evaluated:Patient Re-evaluated prior to induction Oxygen Delivery Method: Circle system utilized Preoxygenation: Pre-oxygenation with 100% oxygen Induction Type: IV induction Ventilation: Mask ventilation without difficulty LMA: LMA inserted LMA Size: 4.0 Number of attempts: 1 Airway Equipment and Method: Bite block Placement Confirmation: positive ETCO2 Tube secured with: Tape Dental Injury: Teeth and Oropharynx as per pre-operative assessment

## 2021-04-25 NOTE — Op Note (Signed)
Right Breast Radioactive seed localized excisional biopsy  Indications: This patient presents with history of abnormal right mammogram with discordant core needle biopsy.    Pre-operative Diagnosis: abnormal right mammogram    Post-operative Diagnosis: abnormal right mammogram  Surgeon: Stark Klein   Anesthesia: General endotracheal anesthesia  ASA Class: 2  Procedure Details  The patient was seen in the Holding Room. The risks, benefits, complications, treatment options, and expected outcomes were discussed with the patient. The possibilities of bleeding, infection, the need for additional procedures, failure to diagnose a condition, and creating a complication requiring transfusion or operation were discussed with the patient. The patient concurred with the proposed plan, giving informed consent.  The site of surgery properly noted/marked. The patient was taken to Operating Room # 7, identified, and the procedure verified as right Breast seed localized excisional biopsy. A Time Out was held and the above information confirmed.  The right breast and chest were prepped and draped in standard fashion. A inferolateral circumareolar incision was made near the previously placed radioactive seed.  Dissection was carried down around the point of maximum signal intensity. The cautery was used to perform the dissection.   The specimen was inked with the margin marker paint kit.   Additional margin was taken posteriorly Specimen radiography confirmed inclusion of the mammographic lesion, the clip, and the seed.  The background signal in the breast was zero.  One clip was placed in the breast cavity.  Hemostasis was achieved with cautery.  The wound was irrigated and closed with 3-0 vicryl interrupted deep dermal sutures and 4-0 monocryl running subcuticular suture.      Sterile dressings were applied. At the end of the operation, all sponge, instrument, and needle counts were correct.  Findings: Seed,  clip in specimen.  Posterior margin is specimen 1 is anterior margin of specimen 2  Estimated Blood Loss:  min         Specimens: right breast tissue, additional posterior margin.         Complications:  None; patient tolerated the procedure well.         Disposition: PACU - hemodynamically stable.         Condition: stable

## 2021-04-25 NOTE — Anesthesia Preprocedure Evaluation (Addendum)
Anesthesia Evaluation  Patient identified by MRN, date of birth, ID band Patient awake    Reviewed: Allergy & Precautions, NPO status , Patient's Chart, lab work & pertinent test results  Airway Mallampati: II  TM Distance: >3 FB Neck ROM: Full    Dental  (+) Edentulous Lower, Edentulous Upper   Pulmonary neg pulmonary ROS, former smoker,    Pulmonary exam normal breath sounds clear to auscultation       Cardiovascular hypertension, Pt. on medications negative cardio ROS Normal cardiovascular exam Rhythm:Regular Rate:Normal     Neuro/Psych Anxiety Depression negative neurological ROS  negative psych ROS   GI/Hepatic negative GI ROS, Neg liver ROS,   Endo/Other  negative endocrine ROS  Renal/GU negative Renal ROS  negative genitourinary   Musculoskeletal negative musculoskeletal ROS (+)   Abdominal   Peds negative pediatric ROS (+)  Hematology negative hematology ROS (+)   Anesthesia Other Findings   Reproductive/Obstetrics negative OB ROS                            Anesthesia Physical Anesthesia Plan  ASA: 2  Anesthesia Plan: General   Post-op Pain Management:    Induction: Intravenous  PONV Risk Score and Plan: 3 and Ondansetron, Dexamethasone, Midazolam and Treatment may vary due to age or medical condition  Airway Management Planned: LMA  Additional Equipment:   Intra-op Plan:   Post-operative Plan: Extubation in OR  Informed Consent: I have reviewed the patients History and Physical, chart, labs and discussed the procedure including the risks, benefits and alternatives for the proposed anesthesia with the patient or authorized representative who has indicated his/her understanding and acceptance.     Dental advisory given  Plan Discussed with: CRNA  Anesthesia Plan Comments:         Anesthesia Quick Evaluation

## 2021-04-26 ENCOUNTER — Encounter (HOSPITAL_BASED_OUTPATIENT_CLINIC_OR_DEPARTMENT_OTHER): Payer: Self-pay | Admitting: General Surgery

## 2021-04-29 LAB — SURGICAL PATHOLOGY

## 2021-05-28 ENCOUNTER — Other Ambulatory Visit: Payer: Self-pay | Admitting: Internal Medicine

## 2021-05-28 ENCOUNTER — Encounter: Payer: Self-pay | Admitting: Internal Medicine

## 2021-05-28 DIAGNOSIS — R072 Precordial pain: Secondary | ICD-10-CM

## 2021-05-28 DIAGNOSIS — I119 Hypertensive heart disease without heart failure: Secondary | ICD-10-CM

## 2021-07-10 ENCOUNTER — Other Ambulatory Visit: Payer: Self-pay

## 2021-07-10 ENCOUNTER — Ambulatory Visit (INDEPENDENT_AMBULATORY_CARE_PROVIDER_SITE_OTHER): Payer: 59

## 2021-07-10 DIAGNOSIS — I119 Hypertensive heart disease without heart failure: Secondary | ICD-10-CM

## 2021-07-10 DIAGNOSIS — R072 Precordial pain: Secondary | ICD-10-CM

## 2021-07-10 LAB — ECHOCARDIOGRAM COMPLETE
Area-P 1/2: 4.15 cm2
S' Lateral: 3.04 cm

## 2021-07-10 NOTE — Progress Notes (Unsigned)
Manual blood pressure 190/110. Patient reports history of white coat syndrome. Patient denied any cerebrovascular symptoms. Instructed patient to check blood pressure at home; if still elevated or any changes notify doctor.  Leavy Cella, RDCS

## 2021-12-25 ENCOUNTER — Encounter (HOSPITAL_COMMUNITY): Payer: Self-pay

## 2022-03-06 ENCOUNTER — Encounter: Payer: Self-pay | Admitting: Internal Medicine

## 2022-03-06 ENCOUNTER — Ambulatory Visit (INDEPENDENT_AMBULATORY_CARE_PROVIDER_SITE_OTHER): Payer: 59 | Admitting: Internal Medicine

## 2022-03-06 VITALS — BP 146/82 | HR 80 | Temp 98.3°F | Ht 62.0 in | Wt 163.0 lb

## 2022-03-06 DIAGNOSIS — R002 Palpitations: Secondary | ICD-10-CM | POA: Diagnosis not present

## 2022-03-06 DIAGNOSIS — M159 Polyosteoarthritis, unspecified: Secondary | ICD-10-CM | POA: Insufficient documentation

## 2022-03-06 DIAGNOSIS — F411 Generalized anxiety disorder: Secondary | ICD-10-CM | POA: Diagnosis not present

## 2022-03-06 DIAGNOSIS — I10 Essential (primary) hypertension: Secondary | ICD-10-CM

## 2022-03-06 DIAGNOSIS — Z0001 Encounter for general adult medical examination with abnormal findings: Secondary | ICD-10-CM | POA: Diagnosis not present

## 2022-03-06 DIAGNOSIS — Z1231 Encounter for screening mammogram for malignant neoplasm of breast: Secondary | ICD-10-CM | POA: Insufficient documentation

## 2022-03-06 LAB — CBC WITH DIFFERENTIAL/PLATELET
Basophils Absolute: 0.1 10*3/uL (ref 0.0–0.1)
Basophils Relative: 0.7 % (ref 0.0–3.0)
Eosinophils Absolute: 0.3 10*3/uL (ref 0.0–0.7)
Eosinophils Relative: 2.3 % (ref 0.0–5.0)
HCT: 38.1 % (ref 36.0–46.0)
Hemoglobin: 12.7 g/dL (ref 12.0–15.0)
Lymphocytes Relative: 41.9 % (ref 12.0–46.0)
Lymphs Abs: 4.6 10*3/uL — ABNORMAL HIGH (ref 0.7–4.0)
MCHC: 33.2 g/dL (ref 30.0–36.0)
MCV: 92.3 fl (ref 78.0–100.0)
Monocytes Absolute: 1.1 10*3/uL — ABNORMAL HIGH (ref 0.1–1.0)
Monocytes Relative: 10 % (ref 3.0–12.0)
Neutro Abs: 5 10*3/uL (ref 1.4–7.7)
Neutrophils Relative %: 45.1 % (ref 43.0–77.0)
Platelets: 356 10*3/uL (ref 150.0–400.0)
RBC: 4.13 Mil/uL (ref 3.87–5.11)
RDW: 13.7 % (ref 11.5–15.5)
WBC: 11.1 10*3/uL — ABNORMAL HIGH (ref 4.0–10.5)

## 2022-03-06 LAB — BASIC METABOLIC PANEL
BUN: 21 mg/dL (ref 6–23)
CO2: 29 mEq/L (ref 19–32)
Calcium: 10.1 mg/dL (ref 8.4–10.5)
Chloride: 103 mEq/L (ref 96–112)
Creatinine, Ser: 0.54 mg/dL (ref 0.40–1.20)
GFR: 100.15 mL/min (ref >60.00)
Glucose, Bld: 92 mg/dL (ref 70–99)
Potassium: 4.4 mEq/L (ref 3.5–5.1)
Sodium: 138 mEq/L (ref 135–145)

## 2022-03-06 LAB — LIPID PANEL
Cholesterol: 172 mg/dL (ref 0–200)
HDL: 52.2 mg/dL (ref >39.00)
LDL Cholesterol: 94 mg/dL (ref 0–99)
NonHDL: 119.42
Total CHOL/HDL Ratio: 3
Triglycerides: 127 mg/dL (ref 0.0–149.0)
VLDL: 25.4 mg/dL (ref 0.0–40.0)

## 2022-03-06 LAB — URINALYSIS, ROUTINE W REFLEX MICROSCOPIC
Bilirubin Urine: NEGATIVE
Ketones, ur: NEGATIVE
Nitrite: NEGATIVE
Specific Gravity, Urine: <=1.005 — AB (ref 1.000–1.030)
Total Protein, Urine: NEGATIVE
Urine Glucose: NEGATIVE
Urobilinogen, UA: 0.2 (ref 0.0–1.0)
pH: 7 (ref 5.0–8.0)

## 2022-03-06 LAB — TSH: TSH: 1.73 u[IU]/mL (ref 0.35–5.50)

## 2022-03-06 LAB — HEPATIC FUNCTION PANEL
ALT: 21 U/L (ref 0–35)
AST: 20 U/L (ref 0–37)
Albumin: 4.5 g/dL (ref 3.5–5.2)
Alkaline Phosphatase: 118 U/L — ABNORMAL HIGH (ref 39–117)
Bilirubin, Direct: 0.1 mg/dL (ref 0.0–0.3)
Total Bilirubin: 0.3 mg/dL (ref 0.2–1.2)
Total Protein: 7.4 g/dL (ref 6.0–8.3)

## 2022-03-06 MED ORDER — DIAZEPAM 2 MG PO TABS: 2.0000 mg | ORAL_TABLET | Freq: Two times a day (BID) | ORAL | 3 refills | Status: DC | PRN

## 2022-03-06 MED ORDER — CELECOXIB 50 MG PO CAPS: 50.0000 mg | ORAL_CAPSULE | Freq: Two times a day (BID) | ORAL | 0 refills | Status: DC

## 2022-03-06 NOTE — Progress Notes (Signed)
Subjective:  Patient ID: Melanie Montgomery, female    DOB: 06-13-1961  Age: 61 y.o. MRN: [id number]  CC: Annual Exam, Hypertension, and Hyperlipidemia   HPI Melanie Montgomery [facility] presents for a CPX and f/up -   She is not taking olmesartan because she thinks it caused leg pain.  She is active and denies chest pain, shortness of breath, diaphoresis, or edema.  She complains of recurrent sensations of heart fluttering.  She has chronic arthralgias and takes Tylenol and ibuprofen to control the pain.  Outpatient Medications Prior to Visit  Medication Sig Dispense Refill   Multiple Vitamin (MULTIVITAMIN) tablet Take 1 tablet by mouth daily.     cyclobenzaprine (FLEXERIL) 10 MG tablet Take 1 tablet (10 mg total) by mouth 3 (three) times daily as needed for muscle spasms. 30 tablet 0   diazepam (VALIUM) 2 MG tablet Take 1 tablet (2 mg total) by mouth every 12 (twelve) hours as needed for anxiety. 15 tablet 0   diclofenac sodium (VOLTAREN) 1 % GEL Apply 4 g topically 4 (four) times daily. 100 g 0   olmesartan (BENICAR) 20 MG tablet Take 20 mg by mouth daily.     oxyCODONE (OXY IR/ROXICODONE) 5 MG immediate release tablet Take 1 tablet (5 mg total) by mouth every 6 (six) hours as needed for severe pain. 15 tablet 0   No facility-administered medications prior to visit.    ROS Review of Systems  Constitutional:  Positive for unexpected weight change (wt gain). Negative for chills, diaphoresis and fatigue.  Respiratory:  Negative for shortness of breath.   Cardiovascular:  Positive for palpitations. Negative for chest pain and leg swelling.  Gastrointestinal:  Negative for abdominal pain, constipation, diarrhea, nausea and vomiting.  Genitourinary: Negative.  Negative for difficulty urinating.  Musculoskeletal:  Positive for arthralgias.  Skin: Negative.  Negative for color change and pallor.  Neurological:  Negative for dizziness, weakness and numbness.  Hematological:  Negative for  adenopathy. Does not bruise/bleed easily.  Psychiatric/Behavioral: Negative.      Objective:  BP (!) 146/82 (BP Location: Left Arm, Patient Position: Sitting, Cuff Size: Large)   Pulse 80   Temp 98.3 F (36.8 C) (Oral)   Ht '5\' 2"'$  (1.575 m)   Wt 163 lb (73.9 kg)   SpO2 96%   BMI 29.81 kg/m   BP Readings from Last 3 Encounters:  03/06/22 (!) 146/82  04/25/21 (!) 152/76  02/26/21 (!) 160/86    Wt Readings from Last 3 Encounters:  03/06/22 163 lb (73.9 kg)  04/25/21 154 lb 12.2 oz (70.2 kg)  02/26/21 155 lb (70.3 kg)    Physical Exam Vitals reviewed.  Constitutional:      Appearance: Normal appearance.  HENT:     Nose: Nose normal.     Mouth/Throat:     Mouth: Mucous membranes are moist.  Eyes:     General: No scleral icterus.    Conjunctiva/sclera: Conjunctivae normal.  Cardiovascular:     Rate and Rhythm: Normal rate and regular rhythm.     Heart sounds: Normal heart sounds, S1 normal and S2 normal.     No friction rub. No gallop.     Comments: EKG- NSR, 73 bpm Normal EKG Pulmonary:     Effort: Pulmonary effort is normal.     Breath sounds: No stridor. No wheezing, rhonchi or rales.  Abdominal:     General: Abdomen is flat.     Palpations: There is no mass.     Tenderness:  There is no abdominal tenderness. There is no guarding.     Hernia: No hernia is present.  Musculoskeletal:     Cervical back: Neck supple.     Right lower leg: No edema.     Left lower leg: No edema.  Lymphadenopathy:     Cervical: No cervical adenopathy.  Skin:    General: Skin is warm and dry.     Coloration: Skin is not pale.  Neurological:     General: No focal deficit present.     Mental Status: She is alert. Mental status is at baseline.  Psychiatric:        Behavior: Behavior normal.     Lab Results  Component Value Date   WBC 11.1 (H) 03/06/2022   HGB 12.7 03/06/2022   HCT 38.1 03/06/2022   PLT 356.0 03/06/2022   GLUCOSE 92 03/06/2022   CHOL 172 03/06/2022   TRIG  127.0 03/06/2022   HDL 52.20 03/06/2022   LDLCALC 94 03/06/2022   ALT 21 03/06/2022   AST 20 03/06/2022   NA 138 03/06/2022   K 4.4 03/06/2022   CL 103 03/06/2022   CREATININE 0.54 03/06/2022   BUN 21 03/06/2022   CO2 29 03/06/2022   TSH 1.73 03/06/2022   HGBA1C 5.6 12/05/2019    MM Breast Surgical Specimen  Result Date: 04/25/2021 CLINICAL DATA:  Surgical excision of a complex sclerosing lesion in lower outer quadrant of the right breast, marked with a coil shaped biopsy marker clip. EXAM: SPECIMEN RADIOGRAPH OF THE RIGHT BREAST COMPARISON:  Previous exam(s). FINDINGS: Status post excision of the right breast. The radioactive seed and coil shaped biopsy marker clip are present, completely intact. IMPRESSION: Specimen radiograph of the right breast. Electronically Signed   By: Claudie Revering M.D.   On: 04/25/2021 14:09   Assessment & Plan:   Lynnda was seen today for annual exam, hypertension and hyperlipidemia.  Diagnoses and all orders for this visit:  Primary hypertension -     EKG 12-Lead -     Basic metabolic panel; Future -     Aldosterone + renin activity w/ ratio; Future -     CBC with Differential/Platelet; Future -     TSH; Future -     Urinalysis, Routine w reflex microscopic; Future -     Hepatic function panel; Future -     Hepatic function panel -     Urinalysis, Routine w reflex microscopic -     TSH -     CBC with Differential/Platelet -     Aldosterone + renin activity w/ ratio -     Basic metabolic panel -     indapamide (LOZOL) 1.25 MG tablet; Take 1 tablet (1.25 mg total) by mouth daily.  Encounter for general adult medical examination with abnormal findings -     Lipid panel; Future -     Lipid panel  Generalized OA -     celecoxib (CELEBREX) 50 MG capsule; Take 1 capsule (50 mg total) by mouth 2 (two) times daily.  GAD (generalized anxiety disorder) -     TSH; Future -     diazepam (VALIUM) 2 MG tablet; Take 1 tablet (2 mg total) by mouth every 12  (twelve) hours as needed for anxiety. -     TSH  Intermittent palpitations -     Basic metabolic panel; Future -     Aldosterone + renin activity w/ ratio; Future -     CBC with Differential/Platelet; Future -  TSH; Future -     CARDIAC EVENT MONITOR; Future -     TSH -     CBC with Differential/Platelet -     Aldosterone + renin activity w/ ratio -     Basic metabolic panel  Visit for screening mammogram -     MM DIGITAL SCREENING BILATERAL; Future   I have discontinued Layal Javid. Faivre's cyclobenzaprine, diclofenac sodium, olmesartan, and oxyCODONE. I am also having her start on celecoxib and indapamide. Additionally, I am having her maintain her multivitamin and diazepam.  Meds ordered this encounter  Medications   celecoxib (CELEBREX) 50 MG capsule    Sig: Take 1 capsule (50 mg total) by mouth 2 (two) times daily.    Dispense:  180 capsule    Refill:  0   diazepam (VALIUM) 2 MG tablet    Sig: Take 1 tablet (2 mg total) by mouth every 12 (twelve) hours as needed for anxiety.    Dispense:  15 tablet    Refill:  3   indapamide (LOZOL) 1.25 MG tablet    Sig: Take 1 tablet (1.25 mg total) by mouth daily.    Dispense:  90 tablet    Refill:  1     Follow-up: Return in about 6 months (around 09/05/2022).  Scarlette Calico, MD

## 2022-03-06 NOTE — Patient Instructions (Signed)

## 2022-03-08 MED ORDER — INDAPAMIDE 1.25 MG PO TABS: 1.2500 mg | ORAL_TABLET | Freq: Every day | ORAL | 1 refills | Status: DC

## 2022-03-12 ENCOUNTER — Ambulatory Visit (INDEPENDENT_AMBULATORY_CARE_PROVIDER_SITE_OTHER): Payer: 59

## 2022-03-12 ENCOUNTER — Encounter: Payer: Self-pay | Admitting: Internal Medicine

## 2022-03-12 DIAGNOSIS — R002 Palpitations: Secondary | ICD-10-CM | POA: Diagnosis not present

## 2022-03-18 LAB — ALDOSTERONE + RENIN ACTIVITY W/ RATIO
ALDO / PRA Ratio: 6.8 Ratio (ref 0.9–28.9)
Aldosterone: 5 ng/dL
Renin Activity: 0.74 ng/mL/h (ref 0.25–5.82)

## 2022-04-17 ENCOUNTER — Encounter: Payer: Self-pay | Admitting: Internal Medicine

## 2022-06-02 ENCOUNTER — Other Ambulatory Visit: Payer: Self-pay | Admitting: Internal Medicine

## 2022-06-02 DIAGNOSIS — M159 Polyosteoarthritis, unspecified: Secondary | ICD-10-CM

## 2022-09-03 ENCOUNTER — Other Ambulatory Visit: Payer: Self-pay | Admitting: Internal Medicine

## 2022-09-03 DIAGNOSIS — F411 Generalized anxiety disorder: Secondary | ICD-10-CM

## 2022-10-18 ENCOUNTER — Other Ambulatory Visit: Payer: Self-pay | Admitting: Internal Medicine

## 2022-10-18 DIAGNOSIS — M159 Polyosteoarthritis, unspecified: Secondary | ICD-10-CM

## 2023-01-13 ENCOUNTER — Other Ambulatory Visit: Payer: Self-pay

## 2023-01-13 ENCOUNTER — Ambulatory Visit (INDEPENDENT_AMBULATORY_CARE_PROVIDER_SITE_OTHER): Payer: 59 | Admitting: Medical

## 2023-01-13 ENCOUNTER — Encounter: Payer: Self-pay | Admitting: Medical

## 2023-01-13 VITALS — BP 128/76 | HR 76 | Ht 62.0 in | Wt 166.6 lb

## 2023-01-13 DIAGNOSIS — Z01419 Encounter for gynecological examination (general) (routine) without abnormal findings: Secondary | ICD-10-CM

## 2023-01-13 DIAGNOSIS — Z1231 Encounter for screening mammogram for malignant neoplasm of breast: Secondary | ICD-10-CM

## 2023-01-13 DIAGNOSIS — Z124 Encounter for screening for malignant neoplasm of cervix: Secondary | ICD-10-CM

## 2023-01-13 NOTE — Progress Notes (Signed)
History:  [name] is a 62 y.o. G3P3000 who presents to clinic today for annual exam. Last pap smear was 04/06/2017 and normal. Patient is not currently sexually active. She is post-menopausal. She denies vaginal bleeding, discharge, UTI symptoms, GI issues or breast concerns today. She has no GYN concerns.   The following portions of the patient's history were reviewed and updated as appropriate: allergies, current medications, family history, past medical history, social history, past surgical history and problem list.  Review of Systems:  Review of Systems  Constitutional:  Negative for fever and malaise/fatigue.  Gastrointestinal:  Negative for abdominal pain, constipation, diarrhea, nausea and vomiting.  Genitourinary:  Negative for dysuria, frequency and urgency.       Neg - vaginal bleeding, discharge, pelvic pain      Objective:  Physical Exam BP 128/76   Pulse 76   Ht 5\' 2"  (1.575 m)   Wt 166 lb 9.6 oz (75.6 kg)   BMI 30.47 kg/m  Physical Exam Vitals and nursing note reviewed. Exam conducted with a chaperone present.  Constitutional:      General: She is not in acute distress.    Appearance: Normal appearance. She is well-developed and normal weight.  HENT:     Head: Normocephalic and atraumatic.  Cardiovascular:     Rate and Rhythm: Normal rate and regular rhythm.     Heart sounds: No murmur heard. Pulmonary:     Effort: Pulmonary effort is normal. No respiratory distress.     Breath sounds: Normal breath sounds. No wheezing.  Chest:  Breasts:    Right: No swelling, bleeding, inverted nipple, mass, nipple discharge, skin change or tenderness.     Left: No swelling, bleeding, inverted nipple, mass, nipple discharge, skin change or tenderness.  Abdominal:     General: Abdomen is flat. Bowel sounds are normal. There is no distension.     Palpations: Abdomen is soft. There is no mass.     Tenderness: There is no abdominal tenderness. There is no  guarding or rebound.  Genitourinary:    General: Normal vulva.     Vagina: No vaginal discharge, erythema, tenderness or bleeding.     Cervix: Cervical bleeding (scant following pap) present. No cervical motion tenderness, discharge, friability, lesion or erythema.     Uterus: Not enlarged and not tender.      Adnexa:        Right: No mass or tenderness.         Left: No mass or tenderness.    Skin:    General: Skin is warm and dry.     Findings: No erythema.  Neurological:     Mental Status: She is alert and oriented to person, place, and time.  Psychiatric:        Mood and Affect: Mood normal.      Health Maintenance Due  Topic Date Due   Zoster Vaccines- Shingrix (1 of 2) Never done   COVID-19 Vaccine (3 - Pfizer risk series) 01/18/2020   PAP SMEAR-Modifier  04/06/2022    Labs, imaging and previous visits in Epic and Care Everywhere reviewed  Assessment & Plan:  1. Encounter for annual routine gynecological examination  2. Pap smear for cervical cancer screening - Cytology - PAP( [place]) - Results will be in MyChart   3. Visit for screening mammogram - MM 3D SCREENING MAMMOGRAM BILATERAL BREAST; Future - Scheduled    Return in about 1 year (around 01/13/2024) for Annual exam.  Magnus Sinning,  Dimas Alexandria, PA-C 01/13/2023 10:17 AM

## 2023-01-20 LAB — CYTOLOGY - PAP: Adequacy: ABNORMAL

## 2023-02-12 ENCOUNTER — Ambulatory Visit
Admission: RE | Admit: 2023-02-12 | Discharge: 2023-02-12 | Disposition: A | Discharge status: Home / Self Care | Payer: 59 | Source: Ambulatory Visit | Attending: Medical | Admitting: Medical

## 2023-02-12 DIAGNOSIS — Z1231 Encounter for screening mammogram for malignant neoplasm of breast: Secondary | ICD-10-CM | POA: Diagnosis not present

## 2023-02-19 ENCOUNTER — Other Ambulatory Visit: Payer: Self-pay | Admitting: Internal Medicine

## 2023-02-19 DIAGNOSIS — F411 Generalized anxiety disorder: Secondary | ICD-10-CM

## 2023-02-24 ENCOUNTER — Other Ambulatory Visit: Payer: Self-pay | Admitting: Internal Medicine

## 2023-02-24 DIAGNOSIS — F411 Generalized anxiety disorder: Secondary | ICD-10-CM

## 2023-03-05 ENCOUNTER — Ambulatory Visit: Payer: 59 | Admitting: Family Medicine

## 2023-03-06 ENCOUNTER — Encounter: Payer: Self-pay | Admitting: Medical

## 2023-03-06 ENCOUNTER — Other Ambulatory Visit (HOSPITAL_COMMUNITY)
Admission: RE | Admit: 2023-03-06 | Discharge: 2023-03-06 | Disposition: A | Discharge status: Home / Self Care | Payer: 59 | Source: Ambulatory Visit | Attending: Medical | Admitting: Medical

## 2023-03-06 ENCOUNTER — Ambulatory Visit (INDEPENDENT_AMBULATORY_CARE_PROVIDER_SITE_OTHER): Payer: 59 | Admitting: Medical

## 2023-03-06 VITALS — BP 174/81 | HR 69 | Ht 62.0 in | Wt 169.4 lb

## 2023-03-06 DIAGNOSIS — R87615 Unsatisfactory cytologic smear of cervix: Secondary | ICD-10-CM

## 2023-03-06 DIAGNOSIS — Z124 Encounter for screening for malignant neoplasm of cervix: Secondary | ICD-10-CM

## 2023-03-06 NOTE — Progress Notes (Signed)
  History:  [name] is a 62 y.o. G3P3000 who presents to clinic today for repeat pap smear. Patient had annual exam with pap smear on 01/13/23 and the sample was found unsatisfactory for evaluation by the lab. She denies any other concerns today.    The following portions of the patient's history were reviewed and updated as appropriate: allergies, current medications, family history, past medical history, social history, past surgical history and problem list.  Review of Systems:  Review of Systems  All other systems reviewed and are negative.     Objective:  Physical Exam BP (!) 174/81   Pulse 69   Ht 5\' 2"  (1.575 m)   Wt 169 lb 6.4 oz (76.8 kg)   BMI 30.98 kg/m  Physical Exam Vitals reviewed. Exam conducted with a chaperone present.  Constitutional:      Appearance: Normal appearance. She is obese. She is not ill-appearing.  Cardiovascular:     Rate and Rhythm: Normal rate.  Pulmonary:     Effort: Pulmonary effort is normal.  Abdominal:     General: Abdomen is flat.  Genitourinary:    General: Normal vulva.     Vagina: No vaginal discharge.     Cervix: Normal.  Skin:    General: Skin is warm and dry.     Findings: No erythema.  Neurological:     Mental Status: She is alert and oriented to person, place, and time.  Psychiatric:        Mood and Affect: Mood normal.     Health Maintenance Due  Topic Date Due   Zoster Vaccines- Shingrix (1 of 2) Never done   COVID-19 Vaccine (3 - Pfizer risk series) 01/18/2020    Assessment & Plan:  1. Cervical cancer screening - Cytology - PAP( [place]) - Results will be shared via MyChart  - If normal, repeat in 5 years, although patient will be > 65 yo, we do not have record of routine pap screenings for the last 10 years   Approximately 5 minutes of total time was spent with this patient on chart review, physical exam, patient education and documentation  Return in about 1 year (around 03/05/2024) for  Annual exam.  Marny Lowenstein, PA-C 03/06/2023 11:28 AM

## 2023-03-06 NOTE — Progress Notes (Signed)
Pt presents for repeat PAP

## 2023-03-10 LAB — CYTOLOGY - PAP
Adequacy: ABSENT
Comment: NEGATIVE
Diagnosis: NEGATIVE
High risk HPV: NEGATIVE

## 2023-03-14 DIAGNOSIS — E669 Obesity, unspecified: Secondary | ICD-10-CM | POA: Diagnosis not present

## 2023-03-14 DIAGNOSIS — F320 Major depressive disorder, single episode, mild: Secondary | ICD-10-CM | POA: Diagnosis not present

## 2023-03-14 DIAGNOSIS — M1990 Unspecified osteoarthritis, unspecified site: Secondary | ICD-10-CM | POA: Diagnosis not present

## 2023-03-14 DIAGNOSIS — Z8249 Family history of ischemic heart disease and other diseases of the circulatory system: Secondary | ICD-10-CM | POA: Diagnosis not present

## 2023-03-14 DIAGNOSIS — Z833 Family history of diabetes mellitus: Secondary | ICD-10-CM | POA: Diagnosis not present

## 2023-03-14 DIAGNOSIS — Z6831 Body mass index (BMI) 31.0-31.9, adult: Secondary | ICD-10-CM | POA: Diagnosis not present

## 2023-03-14 DIAGNOSIS — F419 Anxiety disorder, unspecified: Secondary | ICD-10-CM | POA: Diagnosis not present

## 2023-03-14 DIAGNOSIS — Z809 Family history of malignant neoplasm, unspecified: Secondary | ICD-10-CM | POA: Diagnosis not present

## 2023-03-14 DIAGNOSIS — Z87891 Personal history of nicotine dependence: Secondary | ICD-10-CM | POA: Diagnosis not present

## 2023-03-14 DIAGNOSIS — I10 Essential (primary) hypertension: Secondary | ICD-10-CM | POA: Diagnosis not present

## 2023-03-14 DIAGNOSIS — Z791 Long term (current) use of non-steroidal anti-inflammatories (NSAID): Secondary | ICD-10-CM | POA: Diagnosis not present

## 2023-04-01 ENCOUNTER — Encounter: Payer: 59 | Admitting: Internal Medicine

## 2023-05-06 DIAGNOSIS — H2513 Age-related nuclear cataract, bilateral: Secondary | ICD-10-CM | POA: Diagnosis not present

## 2023-05-06 DIAGNOSIS — H40033 Anatomical narrow angle, bilateral: Secondary | ICD-10-CM | POA: Diagnosis not present

## 2023-05-06 DIAGNOSIS — H18513 Endothelial corneal dystrophy, bilateral: Secondary | ICD-10-CM | POA: Diagnosis not present

## 2023-05-06 DIAGNOSIS — H353121 Nonexudative age-related macular degeneration, left eye, early dry stage: Secondary | ICD-10-CM | POA: Diagnosis not present

## 2023-05-14 ENCOUNTER — Telehealth: Payer: Self-pay | Admitting: Internal Medicine

## 2023-05-14 NOTE — Telephone Encounter (Signed)
Pt would like to transfer from Dr. Sanda Linger to Alfredia Ferguson because she would like a female provider. Please advise if transfer okay.

## 2023-05-14 NOTE — Telephone Encounter (Signed)
Yes, I am ok with this 

## 2023-05-15 NOTE — Telephone Encounter (Signed)
LVM to schedule appt with Alfredia Ferguson. Both providers approved the transfer.

## 2023-05-25 NOTE — Progress Notes (Unsigned)
New patient visit   Patient: Melanie Montgomery   DOB: 03/12/1961   62 y.o. Female  MRN: [id number] Visit Date: 05/26/2023  Today's healthcare provider: Alfredia Ferguson, PA-C  Cc. Right hip pain, borderline blood pressure  Subjective    Melanie Montgomery is a 62 y.o. female who presents today as a new patient to establish care.  HPI  Discussed the use of AI scribe software for clinical note transcription with the patient, who gave verbal consent to proceed.  History of Present Illness   The patient presents with chronic right hip pain that has been ongoing for approximately two years. The pain was previously diagnosed as arthritis, but no imaging was done to confirm this diagnosis. The patient reports that the pain is sometimes so severe that it hinders their ability to walk. The pain is exacerbated by standing and walking, but is relieved when sitting. The patient also reports sciatica pain that started within the past six months. The pain radiates from the right hip down to the calf, and is described as a burning sensation.    She reports taking valium occasionally, describes a period of multiple losses in the family. She typically takes valium for family related activities that bring stress. < 1 a week.      No improvement in hip pain with celebrex.  Past Medical History:  Diagnosis Date   Anemia    Anxiety    Blood transfusion without reported diagnosis    Cataract    Depression    Family history of brain cancer    Family history of breast cancer    Family history of colon cancer    Family history of esophageal cancer    Family history of lung cancer    Family history of stomach cancer    Family history of testicular cancer    Hypertension    Hypoglycemia    Past Surgical History:  Procedure Laterality Date   CESAREAN SECTION     FRACTURE SURGERY     RADIOACTIVE SEED GUIDED EXCISIONAL BREAST BIOPSY Right 04/25/2021   Procedure: RADIOACTIVE SEED GUIDED  EXCISIONAL RIGHT BREAST BIOPSY;  Surgeon: Almond Lint, MD;  Location: [facility];  Service: General;  Laterality: Right;   SPLENECTOMY, TOTAL  1987   TUBAL LIGATION     Family Status  Relation Name Status   Mother  Deceased at age 44   Father  Deceased at age 40   Sister  Deceased   Sister  Alive   Sister  Alive   Sister  Alive   Sister  Deceased at age infant   Sister  Deceased at age infant   Brother  Deceased   Brother  Alive   Brother  Alive   Brother  Alive   Brother  Deceased   Brother  Deceased at age infant   Brother  Deceased at age infant   MGM  Deceased   MGF  Deceased   PGM  Deceased   PGF  Deceased   Daughter  Estate manager/land agent   Daughter  Alive   Mat Aunt Earline Alive   Mat Aunt  Deceased   Mat Aunt  Deceased   Mat Aunt  Deceased   Mat Uncle  Deceased   Mat Uncle  Deceased   Mat Uncle  Deceased   Pat Aunt Jane Deceased   Pat Aunt  Deceased   Pat Uncle Paul Deceased   Oneal Grout JD Deceased  Oneal Grout Al Deceased   Cousin  Alive   Cousin  Alive   Cousin  Deceased   Cousin  Deceased   Cousin  Deceased   Nephew  Alive   Neg Hx  (Not Specified)  No partnership data on file   Family History  Problem Relation Age of Onset   Cancer Mother    Diabetes Mother    Breast cancer Mother 80   Heart disease Father    Cancer Sister    Diabetes Sister    Colon cancer Sister 37   Diabetes Sister    Heart failure Sister    Diabetes Sister    Diabetes Brother    Heart disease Brother    Heart attack Brother    Heart attack Brother 66   Breast cancer Maternal Grandmother        passed at 33   Cancer Maternal Grandfather        unknown "cauliflower" cancer on his side   Breast cancer Maternal Aunt        dx 2s   Lung cancer Maternal Aunt        hx smoking   Esophageal cancer Maternal Uncle    Stomach cancer Paternal Aunt        dx <50   Brain cancer Paternal Uncle        died 6s   Cirrhosis Paternal Uncle    Breast  cancer Cousin        dx 103s (maternal first cousin)   Breast cancer Cousin        dx <50 (maternal first cousin)   Brain cancer Cousin        dx 48s (maternal first cousin)   Cancer Cousin 50       oral cancer (maternal first cousin)   Cancer Cousin 23       unknown cancer (maternal first cousin)   Testicular cancer Nephew 21   Rectal cancer Neg Hx    Social History   Socioeconomic History   Marital status: Divorced    Spouse name: Not on file   Number of children: 3   Years of education: Not on file   Highest education level: Not on file  Occupational History   Occupation: security    Comment: business building  Tobacco Use   Smoking status: Former    Current packs/day: 0.00    Average packs/day: 2.0 packs/day for 31.4 years (62.7 ttl pk-yrs)    Types: Cigarettes    Start date: 08/21/1981    Quit date: 01/02/2013    Years since quitting: 10.4   Smokeless tobacco: Never  Vaping Use   Vaping status: Never Used  Substance and Sexual Activity   Alcohol use: No   Drug use: No   Sexual activity: Not Currently    Partners: Male  Other Topics Concern   Not on file  Social History Narrative   Lives at home alone.       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   Social Determinants of Corporate investment banker Strain: Low Risk  (05/25/2018)   Overall Financial Resource Strain (CARDIA)    Difficulty of Paying Living Expenses: Not hard at all  Food Insecurity: No Food Insecurity (05/25/2018)   Hunger Vital Sign    Worried About Running Out of Food in the Last Year: Never true  Ran Out of Food in the Last Year: Never true  Transportation Needs: No Transportation Needs (05/25/2018)   PRAPARE - Administrator, Civil Service (Medical): No    Lack of Transportation (Non-Medical): No  Physical Activity: Inactive (05/25/2018)   Exercise Vital Sign    Days of Exercise per Week: 0 days    Minutes of  Exercise per Session: 0 min  Stress: Stress Concern Present (05/25/2018)   [employer] of Occupational Health - Occupational Stress Questionnaire    Feeling of Stress : Rather much  Social Connections: Somewhat Isolated (05/25/2018)   Social Connection and Isolation Panel [NHANES]    Frequency of Communication with Friends and Family: Once a week    Frequency of Social Gatherings with Friends and Family: Once a week    Attends Religious Services: More than 4 times per year    Active Member of [employer] or Organizations: Yes    Attends Engineer, structural: More than 4 times per year    Marital Status: Divorced   Outpatient Medications Prior to Visit  Medication Sig   celecoxib (CELEBREX) 50 MG capsule TAKE 1 CAPSULE BY MOUTH TWICE A DAY   Multiple Vitamin (MULTIVITAMIN) tablet Take 1 tablet by mouth daily.   [DISCONTINUED] diazepam (VALIUM) 2 MG tablet TAKE 1 TABLET BY MOUTH EVERY 12 HOURS AS NEEDED FOR ANXIETY.   [DISCONTINUED] indapamide (LOZOL) 1.25 MG tablet Take 1 tablet (1.25 mg total) by mouth daily.   No facility-administered medications prior to visit.   No Known Allergies  Immunization History  Administered Date(s) Administered   Influenza,inj,Quad PF,6+ Mos 08/20/2015, 05/06/2017, 05/25/2018, 06/29/2020   PFIZER(Purple Top)SARS-COV-2 Vaccination 12/01/2019, 12/21/2019   Pneumococcal Conjugate-13 04/06/2017   Tdap 04/06/2017    Health Maintenance  Topic Date Due   Zoster Vaccines- Shingrix (1 of 2) Never done   Lung Cancer Screening  04/21/2018   COVID-19 Vaccine (3 - Pfizer risk series) 01/18/2020   INFLUENZA VACCINE  04/09/2023   MAMMOGRAM  02/11/2025   DTaP/Tdap/Td (2 - Td or Tdap) 04/07/2027   Cervical Cancer Screening (HPV/Pap Cotest)  03/05/2028   Colonoscopy  07/19/2028   Hepatitis C Screening  Completed   HIV Screening  Completed   HPV VACCINES  Aged Out    Patient Care Team: Alfredia Ferguson, PA-C as PCP - General (Physician  Assistant)  Review of Systems  Constitutional:  Negative for fatigue and fever.  Respiratory:  Negative for cough and shortness of breath.   Cardiovascular:  Negative for chest pain and leg swelling.  Gastrointestinal:  Negative for abdominal pain.  Musculoskeletal:  Positive for arthralgias.  Neurological:  Negative for dizziness and headaches.      Objective    BP (!) 146/82 (BP Location: Left Arm, Patient Position: Sitting)   Pulse 79   Temp 98.1 F (36.7 C) (Oral)   Resp 20   Ht 5\' 2"  (1.575 m)   Wt 168 lb (76.2 kg)   SpO2 98%   BMI 30.73 kg/m   Physical Exam Constitutional:      General: She is awake.     Appearance: She is well-developed.  HENT:     Head: Normocephalic.  Eyes:     Conjunctiva/sclera: Conjunctivae normal.  Cardiovascular:     Rate and Rhythm: Normal rate and regular rhythm.     Heart sounds: Normal heart sounds.  Pulmonary:     Effort: Pulmonary effort is normal.     Breath sounds: Normal breath sounds.  Musculoskeletal:  Comments: Right hip pain with inversion and eversion Negative SLR bilaterally  Skin:    General: Skin is warm.  Neurological:     Mental Status: She is alert and oriented to person, place, and time.  Psychiatric:        Attention and Perception: Attention normal.        Mood and Affect: Mood normal.        Speech: Speech normal.        Behavior: Behavior is cooperative.     Depression Screen    05/26/2023    1:43 PM 01/13/2023   10:00 AM 06/29/2020   11:28 AM 06/01/2020    8:22 AM  PHQ 2/9 Scores  PHQ - 2 Score 1 2 0 0  PHQ- 9 Score 11 8  2    No results found for any visits on 05/26/23.  Assessment & Plan      Problem List Items Addressed This Visit       Cardiovascular and Mediastinum   Primary hypertension    Reviewed visit history, consistently elevated blood pressure readings at multiple visits. -Start Lisinopril 10mg  daily for blood pressure control. -Check blood pressure in 1 month to assess  response to medication. -advised pt to check at home/work      Relevant Medications   lisinopril (ZESTRIL) 10 MG tablet   Other Relevant Orders   Comp Met (CMET)   CBC w/Diff     Other   Situational anxiety    Occasional use of Valium for situational stressors. -Continue current use of Valium as needed for anxiety.      Relevant Medications   diazepam (VALIUM) 2 MG tablet   Chronic right hip pain - Primary    -Order hip X-rays today  -pending imaging, plan to Refer to an orthopedic specialist for further evaluation.      Relevant Orders   DG Hip Unilat W OR W/O Pelvis 2-3 Views Right   Other Visit Diagnoses     Lipid screening       Relevant Orders   Lipid panel        General Health Maintenance -Order fasting blood work  -Follow up in 1 month to review blood work results and assess blood pressure control.       Return in about 4 weeks (around 06/23/2023) for hypertension.     I, Alfredia Ferguson, PA-C have reviewed all documentation for this visit. The documentation on  05/26/23   for the exam, diagnosis, procedures, and orders are all accurate and complete.    Alfredia Ferguson, PA-C  [facility] at [facility] [phone]) [phone] (fax)  [facility]

## 2023-05-26 ENCOUNTER — Ambulatory Visit: Payer: 59 | Admitting: Physician Assistant

## 2023-05-26 ENCOUNTER — Encounter: Payer: Self-pay | Admitting: Physician Assistant

## 2023-05-26 ENCOUNTER — Ambulatory Visit (HOSPITAL_BASED_OUTPATIENT_CLINIC_OR_DEPARTMENT_OTHER)
Admission: RE | Admit: 2023-05-26 | Discharge: 2023-05-26 | Disposition: A | Discharge status: Home / Self Care | Payer: 59 | Source: Ambulatory Visit | Attending: Physician Assistant | Admitting: Physician Assistant

## 2023-05-26 VITALS — BP 146/82 | HR 79 | Temp 98.1°F | Resp 20 | Ht 62.0 in | Wt 168.0 lb

## 2023-05-26 DIAGNOSIS — Z13220 Encounter for screening for lipoid disorders: Secondary | ICD-10-CM

## 2023-05-26 DIAGNOSIS — M160 Bilateral primary osteoarthritis of hip: Secondary | ICD-10-CM | POA: Diagnosis not present

## 2023-05-26 DIAGNOSIS — I10 Essential (primary) hypertension: Secondary | ICD-10-CM | POA: Diagnosis not present

## 2023-05-26 DIAGNOSIS — F418 Other specified anxiety disorders: Secondary | ICD-10-CM | POA: Diagnosis not present

## 2023-05-26 DIAGNOSIS — G8929 Other chronic pain: Secondary | ICD-10-CM

## 2023-05-26 DIAGNOSIS — M25551 Pain in right hip: Secondary | ICD-10-CM | POA: Diagnosis not present

## 2023-05-26 DIAGNOSIS — Z8669 Personal history of other diseases of the nervous system and sense organs: Secondary | ICD-10-CM | POA: Insufficient documentation

## 2023-05-26 MED ORDER — LISINOPRIL 10 MG PO TABS: 10.0000 mg | ORAL_TABLET | Freq: Every day | ORAL | 3 refills | Status: DC

## 2023-05-26 MED ORDER — DIAZEPAM 2 MG PO TABS
2.0000 mg | ORAL_TABLET | Freq: Two times a day (BID) | ORAL | 0 refills | Status: DC | PRN
Start: 2023-05-26 — End: 2024-08-01

## 2023-05-26 NOTE — Assessment & Plan Note (Signed)
L eye. Being monitored.

## 2023-05-26 NOTE — Assessment & Plan Note (Signed)
-  Order hip X-rays today  -pending imaging, plan to Refer to an orthopedic specialist for further evaluation.

## 2023-05-26 NOTE — Assessment & Plan Note (Signed)
Occasional use of Valium for situational stressors. -Continue current use of Valium as needed for anxiety.

## 2023-05-26 NOTE — Assessment & Plan Note (Addendum)
Reviewed visit history, consistently elevated blood pressure readings at multiple visits. -Start Lisinopril 10mg  daily for blood pressure control. -Check blood pressure in 1 month to assess response to medication. -advised pt to check at home/work

## 2023-06-11 ENCOUNTER — Encounter: Payer: Self-pay | Admitting: Physician Assistant

## 2023-06-12 ENCOUNTER — Other Ambulatory Visit: Payer: Self-pay | Admitting: Physician Assistant

## 2023-06-12 DIAGNOSIS — G8929 Other chronic pain: Secondary | ICD-10-CM

## 2023-06-16 ENCOUNTER — Other Ambulatory Visit (INDEPENDENT_AMBULATORY_CARE_PROVIDER_SITE_OTHER): Payer: 59

## 2023-06-16 DIAGNOSIS — Z13220 Encounter for screening for lipoid disorders: Secondary | ICD-10-CM

## 2023-06-16 DIAGNOSIS — I10 Essential (primary) hypertension: Secondary | ICD-10-CM | POA: Diagnosis not present

## 2023-06-17 ENCOUNTER — Other Ambulatory Visit: Payer: Self-pay | Admitting: Physician Assistant

## 2023-06-17 DIAGNOSIS — M25551 Pain in right hip: Secondary | ICD-10-CM | POA: Diagnosis not present

## 2023-06-17 DIAGNOSIS — D72829 Elevated white blood cell count, unspecified: Secondary | ICD-10-CM

## 2023-06-17 LAB — COMPREHENSIVE METABOLIC PANEL
ALT: 25 U/L (ref 0–35)
AST: 19 U/L (ref 0–37)
Albumin: 4.2 g/dL (ref 3.5–5.2)
Alkaline Phosphatase: 124 U/L — ABNORMAL HIGH (ref 39–117)
BUN: 14 mg/dL (ref 6–23)
CO2: 28 meq/L (ref 19–32)
Calcium: 10.1 mg/dL (ref 8.4–10.5)
Chloride: 103 meq/L (ref 96–112)
Creatinine, Ser: 0.51 mg/dL (ref 0.40–1.20)
GFR: 100.63 mL/min (ref >60.00)
Glucose, Bld: 96 mg/dL (ref 70–99)
Potassium: 4.4 meq/L (ref 3.5–5.1)
Sodium: 138 meq/L (ref 135–145)
Total Bilirubin: 0.2 mg/dL (ref 0.2–1.2)
Total Protein: 6.4 g/dL (ref 6.0–8.3)

## 2023-06-17 LAB — CBC WITH DIFFERENTIAL/PLATELET
Basophils Absolute: 0.2 10*3/uL — ABNORMAL HIGH (ref 0.0–0.1)
Basophils Relative: 1.8 % (ref 0.0–3.0)
Eosinophils Absolute: 0.3 10*3/uL (ref 0.0–0.7)
Eosinophils Relative: 2.3 % (ref 0.0–5.0)
HCT: 40.2 % (ref 36.0–46.0)
Hemoglobin: 13.2 g/dL (ref 12.0–15.0)
Lymphocytes Relative: 41.5 % (ref 12.0–46.0)
Lymphs Abs: 5.3 10*3/uL — ABNORMAL HIGH (ref 0.7–4.0)
MCHC: 32.8 g/dL (ref 30.0–36.0)
MCV: 93.4 fL (ref 78.0–100.0)
Monocytes Absolute: 1 10*3/uL (ref 0.1–1.0)
Monocytes Relative: 8 % (ref 3.0–12.0)
Neutro Abs: 6 10*3/uL (ref 1.4–7.7)
Neutrophils Relative %: 46.4 % (ref 43.0–77.0)
Platelets: 404 10*3/uL — ABNORMAL HIGH (ref 150.0–400.0)
RBC: 4.31 Mil/uL (ref 3.87–5.11)
RDW: 13 % (ref 11.5–15.5)
WBC: 12.9 10*3/uL — ABNORMAL HIGH (ref 4.0–10.5)

## 2023-06-17 LAB — LIPID PANEL
Cholesterol: 229 mg/dL — ABNORMAL HIGH (ref 0–200)
HDL: 53.8 mg/dL (ref >39.00)
LDL Cholesterol: 126 mg/dL — ABNORMAL HIGH (ref 0–99)
NonHDL: 175.65
Total CHOL/HDL Ratio: 4
Triglycerides: 247 mg/dL — ABNORMAL HIGH (ref 0.0–149.0)
VLDL: 49.4 mg/dL — ABNORMAL HIGH (ref 0.0–40.0)

## 2023-06-25 ENCOUNTER — Ambulatory Visit: Payer: 59 | Admitting: Physician Assistant

## 2023-06-25 ENCOUNTER — Encounter: Payer: Self-pay | Admitting: Physician Assistant

## 2023-06-25 VITALS — BP 135/70 | HR 66 | Temp 98.0°F | Ht 62.0 in | Wt 169.5 lb

## 2023-06-25 DIAGNOSIS — R400 Somnolence: Secondary | ICD-10-CM | POA: Diagnosis not present

## 2023-06-25 DIAGNOSIS — I10 Essential (primary) hypertension: Secondary | ICD-10-CM

## 2023-06-25 MED ORDER — LISINOPRIL 20 MG PO TABS
20.0000 mg | ORAL_TABLET | Freq: Every day | ORAL | 3 refills | Status: DC
Start: 2023-06-25 — End: 2023-08-20

## 2023-06-25 NOTE — Assessment & Plan Note (Signed)
Significant difference in office vs home values, likely an element of white coat HTN Advised increase lisinopril to 20 mg daily and continue to monitor BP.  Will f/u in 4 weeks.

## 2023-06-25 NOTE — Progress Notes (Signed)
Established patient visit   Patient: Melanie Montgomery   DOB: 1961/06/05   62 y.o. Female  MRN: [id number] Visit Date: 06/25/2023  Today's healthcare provider: Alfredia Ferguson, PA-C   Cc. Hypertension f/u  Subjective     Hypertension, follow-up  BP Readings from Last 3 Encounters:  06/25/23 135/70  05/26/23 (!) 146/82  03/06/23 (!) 174/81   Wt Readings from Last 3 Encounters:  06/25/23 169 lb 8 oz (76.9 kg)  05/26/23 168 lb (76.2 kg)  03/06/23 169 lb 6.4 oz (76.8 kg)     She was last seen for hypertension 1 mo ago. She was started on lisinopril 10 mg.   Outside blood pressures are 135/70 but in the after noon will go up 140-150  Pertinent labs Lab Results  Component Value Date   CHOL 229 (H) 06/16/2023   HDL 53.80 06/16/2023   LDLCALC 126 (H) 06/16/2023   TRIG 247.0 (H) 06/16/2023   CHOLHDL 4 06/16/2023   Lab Results  Component Value Date   NA 138 06/16/2023   K 4.4 06/16/2023   CREATININE 0.51 06/16/2023   GFRNONAA 106 12/05/2019   GLUCOSE 96 06/16/2023   TSH 1.73 03/06/2022     The 10-year ASCVD risk score (Arnett DK, et al., 2019) is: 6.1%  --------------------------------------------------------------------------------------------------- Pt reports increased fatigue during the day-- that she can fall asleep quickly, takes several naps. Her sleep has been interrupted by her hip pain-- she has an upcoming MRI w/ ortho.   Medications: Outpatient Medications Prior to Visit  Medication Sig   celecoxib (CELEBREX) 50 MG capsule TAKE 1 CAPSULE BY MOUTH TWICE A DAY   diazepam (VALIUM) 2 MG tablet Take 1 tablet (2 mg total) by mouth every 12 (twelve) hours as needed. for anxiety   Multiple Vitamin (MULTIVITAMIN) tablet Take 1 tablet by mouth daily.   [DISCONTINUED] lisinopril (ZESTRIL) 10 MG tablet Take 1 tablet (10 mg total) by mouth daily.   No facility-administered medications prior to visit.    Review of Systems  Constitutional:  Negative for  fatigue and fever.  Respiratory:  Negative for cough and shortness of breath.   Cardiovascular:  Negative for chest pain and leg swelling.  Gastrointestinal:  Negative for abdominal pain.  Musculoskeletal:  Positive for arthralgias.  Neurological:  Negative for dizziness and headaches.       Objective    BP 135/70 Comment: home value  Pulse 66   Temp 98 F (36.7 C)   Ht 5\' 2"  (1.575 m)   Wt 169 lb 8 oz (76.9 kg)   SpO2 98%   BMI 31.00 kg/m    Physical Exam Vitals reviewed.  Constitutional:      Appearance: She is not ill-appearing.  HENT:     Head: Normocephalic.  Eyes:     Conjunctiva/sclera: Conjunctivae normal.  Cardiovascular:     Rate and Rhythm: Normal rate.  Pulmonary:     Effort: Pulmonary effort is normal. No respiratory distress.  Neurological:     General: No focal deficit present.     Mental Status: She is alert and oriented to person, place, and time.  Psychiatric:        Mood and Affect: Mood normal.        Behavior: Behavior normal.      No results found for any visits on 06/25/23.  Assessment & Plan    1. Primary hypertension Significant difference in office vs home values, likely an element of white coat HTN Advised  increase lisinopril to 20 mg daily and continue to monitor BP.  Will f/u in 4 weeks. - lisinopril (ZESTRIL) 20 MG tablet; Take 1 tablet (20 mg total) by mouth daily.  Dispense: 90 tablet; Refill: 3  2. Daytime sleepiness We will table discussion for now, but did mention possibility of sleep apnea and sleep study.  Return in about 4 weeks (around 07/23/2023) for hypertension.       Alfredia Ferguson, PA-C  [facility] at [facility] [phone] (phone) [phone] (fax)  [facility]

## 2023-07-03 DIAGNOSIS — M25551 Pain in right hip: Secondary | ICD-10-CM | POA: Diagnosis not present

## 2023-07-09 DIAGNOSIS — M5441 Lumbago with sciatica, right side: Secondary | ICD-10-CM | POA: Diagnosis not present

## 2023-07-09 DIAGNOSIS — M545 Low back pain, unspecified: Secondary | ICD-10-CM | POA: Diagnosis not present

## 2023-07-09 DIAGNOSIS — M7061 Trochanteric bursitis, right hip: Secondary | ICD-10-CM | POA: Diagnosis not present

## 2023-07-14 DIAGNOSIS — M7061 Trochanteric bursitis, right hip: Secondary | ICD-10-CM | POA: Diagnosis not present

## 2023-07-14 DIAGNOSIS — M6281 Muscle weakness (generalized): Secondary | ICD-10-CM | POA: Diagnosis not present

## 2023-07-16 DIAGNOSIS — M7061 Trochanteric bursitis, right hip: Secondary | ICD-10-CM | POA: Diagnosis not present

## 2023-07-16 DIAGNOSIS — M6281 Muscle weakness (generalized): Secondary | ICD-10-CM | POA: Diagnosis not present

## 2023-07-23 DIAGNOSIS — M6281 Muscle weakness (generalized): Secondary | ICD-10-CM | POA: Diagnosis not present

## 2023-07-23 DIAGNOSIS — M7061 Trochanteric bursitis, right hip: Secondary | ICD-10-CM | POA: Diagnosis not present

## 2023-07-28 DIAGNOSIS — M6281 Muscle weakness (generalized): Secondary | ICD-10-CM | POA: Diagnosis not present

## 2023-07-28 DIAGNOSIS — M7061 Trochanteric bursitis, right hip: Secondary | ICD-10-CM | POA: Diagnosis not present

## 2023-07-30 ENCOUNTER — Ambulatory Visit: Payer: 59 | Admitting: Physician Assistant

## 2023-07-30 ENCOUNTER — Other Ambulatory Visit: Payer: 59

## 2023-07-30 ENCOUNTER — Encounter: Payer: Self-pay | Admitting: Physician Assistant

## 2023-07-30 VITALS — BP 139/92 | HR 70 | Temp 97.7°F | Ht 62.0 in | Wt 166.1 lb

## 2023-07-30 DIAGNOSIS — M6281 Muscle weakness (generalized): Secondary | ICD-10-CM | POA: Diagnosis not present

## 2023-07-30 DIAGNOSIS — M7061 Trochanteric bursitis, right hip: Secondary | ICD-10-CM | POA: Diagnosis not present

## 2023-07-30 DIAGNOSIS — D72829 Elevated white blood cell count, unspecified: Secondary | ICD-10-CM | POA: Diagnosis not present

## 2023-07-30 DIAGNOSIS — I10 Essential (primary) hypertension: Secondary | ICD-10-CM

## 2023-07-30 MED ORDER — HYDROCHLOROTHIAZIDE 12.5 MG PO CAPS
12.5000 mg | ORAL_CAPSULE | Freq: Every day | ORAL | 1 refills | Status: DC
Start: 2023-07-30 — End: 2024-02-08

## 2023-07-30 NOTE — Assessment & Plan Note (Signed)
Discussed adding hydrochlorothiazide 12.5 mg to lisinopril 20 in AM  Discussed salt consumption/diet/exercise F/u 4 weeks, cont to monitor at home

## 2023-07-30 NOTE — Progress Notes (Signed)
Established patient visit   Patient: Melanie Montgomery   DOB: 1960-12-24   62 y.o. Female  MRN: [id number] Visit Date: 07/30/2023  Today's healthcare provider: Alfredia Ferguson, PA-C   Chief Complaint  Patient presents with   Medical Management of Chronic Issues    Patient states things are well. Does have a list of BP from home.    Subjective     Hypertension, follow-up  BP Readings from Last 3 Encounters:  07/30/23 (!) 139/92  06/25/23 135/70  05/26/23 (!) 146/82   Wt Readings from Last 3 Encounters:  07/30/23 166 lb 2 oz (75.4 kg)  06/25/23 169 lb 8 oz (76.9 kg)  05/26/23 168 lb (76.2 kg)      Outside blood pressures are 140/78, 139/92, 11/13 lowest 122/67 Pertinent labs Lab Results  Component Value Date   CHOL 229 (H) 06/16/2023   HDL 53.80 06/16/2023   LDLCALC 126 (H) 06/16/2023   TRIG 247.0 (H) 06/16/2023   CHOLHDL 4 06/16/2023   Lab Results  Component Value Date   NA 138 06/16/2023   K 4.4 06/16/2023   CREATININE 0.51 06/16/2023   GFRNONAA 106 12/05/2019   GLUCOSE 96 06/16/2023   TSH 1.73 03/06/2022     The 10-year ASCVD risk score (Arnett DK, et al., 2019) is: 6.4%  ---------------------------------------------------------------------------------------------------  Medications: Outpatient Medications Prior to Visit  Medication Sig   celecoxib (CELEBREX) 50 MG capsule TAKE 1 CAPSULE BY MOUTH TWICE A DAY   diazepam (VALIUM) 2 MG tablet Take 1 tablet (2 mg total) by mouth every 12 (twelve) hours as needed. for anxiety   lisinopril (ZESTRIL) 20 MG tablet Take 1 tablet (20 mg total) by mouth daily.   Multiple Vitamin (MULTIVITAMIN) tablet Take 1 tablet by mouth daily.   No facility-administered medications prior to visit.    Review of Systems  Constitutional:  Negative for fatigue and fever.  Respiratory:  Negative for cough and shortness of breath.   Cardiovascular:  Negative for chest pain and leg swelling.  Gastrointestinal:  Negative  for abdominal pain.  Neurological:  Negative for dizziness and headaches.       Objective    BP (!) 139/92 Comment: home value  Pulse 70   Temp 97.7 F (36.5 C) (Oral)   Ht 5\' 2"  (1.575 m)   Wt 166 lb 2 oz (75.4 kg)   SpO2 99%   BMI 30.38 kg/m    Physical Exam Vitals reviewed.  Constitutional:      Appearance: She is not ill-appearing.  HENT:     Head: Normocephalic.  Eyes:     Conjunctiva/sclera: Conjunctivae normal.  Cardiovascular:     Rate and Rhythm: Normal rate.  Pulmonary:     Effort: Pulmonary effort is normal. No respiratory distress.  Neurological:     General: No focal deficit present.     Mental Status: She is alert and oriented to person, place, and time.  Psychiatric:        Mood and Affect: Mood normal.        Behavior: Behavior normal.      No results found for any visits on 07/30/23.  Assessment & Plan    Primary hypertension Assessment & Plan: Discussed adding hydrochlorothiazide 12.5 mg to lisinopril 20 in AM  Discussed salt consumption/diet/exercise F/u 4 weeks, cont to monitor at home  Orders: -     hydroCHLOROthiazide; Take 1 capsule (12.5 mg total) by mouth daily. In the morning  Dispense: 90 capsule;  Refill: 1  Leukocytosis, unspecified type Assessment & Plan: Repeat cbc for improvement / stability  Orders: -     CBC with Differential/Platelet -     CBC with Differential/Platelet    Return in about 4 weeks (around 08/27/2023) for hypertension.       Alfredia Ferguson, PA-C  [facility] at [facility] [phone] (phone) [phone] (fax)  [facility]

## 2023-07-30 NOTE — Assessment & Plan Note (Signed)
Repeat cbc for improvement / stability

## 2023-07-31 LAB — CBC WITH DIFFERENTIAL/PLATELET
Basophils Absolute: 0.2 10*3/uL — ABNORMAL HIGH (ref 0.0–0.1)
Basophils Relative: 1.5 % (ref 0.0–3.0)
Eosinophils Absolute: 0.3 10*3/uL (ref 0.0–0.7)
Eosinophils Relative: 2.5 % (ref 0.0–5.0)
HCT: 39.5 % (ref 36.0–46.0)
Hemoglobin: 13.1 g/dL (ref 12.0–15.0)
Lymphocytes Relative: 40.6 % (ref 12.0–46.0)
Lymphs Abs: 4.2 10*3/uL — ABNORMAL HIGH (ref 0.7–4.0)
MCHC: 33.3 g/dL (ref 30.0–36.0)
MCV: 94.3 fL (ref 78.0–100.0)
Monocytes Absolute: 1 10*3/uL (ref 0.1–1.0)
Monocytes Relative: 9.7 % (ref 3.0–12.0)
Neutro Abs: 4.7 10*3/uL (ref 1.4–7.7)
Neutrophils Relative %: 45.7 % (ref 43.0–77.0)
Platelets: 355 10*3/uL (ref 150.0–400.0)
RBC: 4.19 Mil/uL (ref 3.87–5.11)
RDW: 13.8 % (ref 11.5–15.5)
WBC: 10.3 10*3/uL (ref 4.0–10.5)

## 2023-08-03 DIAGNOSIS — M7061 Trochanteric bursitis, right hip: Secondary | ICD-10-CM | POA: Diagnosis not present

## 2023-08-03 DIAGNOSIS — M6281 Muscle weakness (generalized): Secondary | ICD-10-CM | POA: Diagnosis not present

## 2023-08-04 DIAGNOSIS — M7061 Trochanteric bursitis, right hip: Secondary | ICD-10-CM | POA: Diagnosis not present

## 2023-08-04 DIAGNOSIS — M6281 Muscle weakness (generalized): Secondary | ICD-10-CM | POA: Diagnosis not present

## 2023-08-11 ENCOUNTER — Other Ambulatory Visit: Payer: Self-pay | Admitting: Physician Assistant

## 2023-08-11 ENCOUNTER — Encounter: Payer: Self-pay | Admitting: Physician Assistant

## 2023-08-11 DIAGNOSIS — M7061 Trochanteric bursitis, right hip: Secondary | ICD-10-CM | POA: Diagnosis not present

## 2023-08-11 DIAGNOSIS — M6281 Muscle weakness (generalized): Secondary | ICD-10-CM | POA: Diagnosis not present

## 2023-08-11 DIAGNOSIS — I10 Essential (primary) hypertension: Secondary | ICD-10-CM

## 2023-08-11 DIAGNOSIS — R252 Cramp and spasm: Secondary | ICD-10-CM

## 2023-08-20 ENCOUNTER — Encounter: Payer: Self-pay | Admitting: Physician Assistant

## 2023-08-20 ENCOUNTER — Other Ambulatory Visit: Payer: Self-pay | Admitting: Physician Assistant

## 2023-08-27 ENCOUNTER — Ambulatory Visit: Payer: 59 | Admitting: Physician Assistant

## 2023-12-09 ENCOUNTER — Ambulatory Visit: Admitting: Physician Assistant

## 2023-12-09 ENCOUNTER — Encounter: Payer: Self-pay | Admitting: Physician Assistant

## 2023-12-09 VITALS — BP 138/83 | HR 90 | Ht 62.0 in | Wt 177.2 lb

## 2023-12-09 DIAGNOSIS — S39012A Strain of muscle, fascia and tendon of lower back, initial encounter: Secondary | ICD-10-CM | POA: Diagnosis not present

## 2023-12-09 MED ORDER — BACLOFEN 10 MG PO TABS
10.0000 mg | ORAL_TABLET | Freq: Three times a day (TID) | ORAL | 0 refills | Status: DC
Start: 2023-12-09 — End: 2024-04-21

## 2023-12-09 MED ORDER — CELECOXIB 100 MG PO CAPS
100.0000 mg | ORAL_CAPSULE | Freq: Two times a day (BID) | ORAL | 0 refills | Status: DC
Start: 2023-12-09 — End: 2024-08-01

## 2023-12-09 NOTE — Progress Notes (Signed)
      Established patient visit   Patient: Melanie Montgomery   DOB: [birth year]   63 y.o. Female  MRN: [id number] Visit Date: 12/09/2023  Today's healthcare provider: Alfredia Ferguson, PA-C   Cc. Low back pain x 1 week  Subjective     Pt reports low back pain x 1 week. She reports she was spring cleaning, cleaning baseboards and her low back started to spasm. Denies radiating pain, numbness, bowel or bladder changes. Using topical heat, ice, pain patch. Taking motrin/alleve otc with no improvement.   Medications: Outpatient Medications Prior to Visit  Medication Sig   diazepam (VALIUM) 2 MG tablet Take 1 tablet (2 mg total) by mouth every 12 (twelve) hours as needed. for anxiety   hydrochlorothiazide (MICROZIDE) 12.5 MG capsule Take 1 capsule (12.5 mg total) by mouth daily. In the morning   lisinopril (ZESTRIL) 10 MG tablet Take 10 mg by mouth daily.   Multiple Vitamin (MULTIVITAMIN) tablet Take 1 tablet by mouth daily.   [DISCONTINUED] celecoxib (CELEBREX) 50 MG capsule TAKE 1 CAPSULE BY MOUTH TWICE A DAY (Patient not taking: Reported on 12/09/2023)   No facility-administered medications prior to visit.    Review of Systems  Constitutional:  Negative for fatigue and fever.  Respiratory:  Negative for cough and shortness of breath.   Cardiovascular:  Negative for chest pain and leg swelling.  Gastrointestinal:  Negative for abdominal pain.  Musculoskeletal:  Positive for back pain.  Neurological:  Negative for dizziness and headaches.       Objective    BP 138/83   Pulse 90   Ht 5\' 2"  (1.575 m)   Wt 177 lb 3.2 oz (80.4 kg)   BMI 32.41 kg/m    Physical Exam Vitals reviewed.  Constitutional:      Appearance: She is not ill-appearing.  HENT:     Head: Normocephalic.  Eyes:     Conjunctiva/sclera: Conjunctivae normal.  Cardiovascular:     Rate and Rhythm: Normal rate.  Pulmonary:     Effort: Pulmonary effort is normal. No respiratory distress.  Musculoskeletal:      Comments: No visible rashes, edema. Non tender to palpation  Neurological:     Mental Status: She is alert and oriented to person, place, and time.  Psychiatric:        Mood and Affect: Mood normal.        Behavior: Behavior normal.     No results found for any visits on 12/09/23.  Assessment & Plan    Strain of lumbar region, initial encounter -     Celecoxib; Take 1 capsule (100 mg total) by mouth 2 (two) times daily.  Dispense: 20 capsule; Refill: 0 -     Baclofen; Take 1 tablet (10 mg total) by mouth 3 (three) times daily.  Dispense: 21 each; Refill: 0  Recommending light stretching, demonstrated a few in office. Topical heat, ice. Rx celebrex bid, baclofen up to tid cautioned drowsiness.  If no improvement to f/b with office.  Return if symptoms worsen or fail to improve.      Alfredia Ferguson, PA-C  [facility] at [facility] [phone] (phone) [phone] (fax)  [facility]

## 2024-01-18 ENCOUNTER — Encounter: Payer: Self-pay | Admitting: Physician Assistant

## 2024-01-20 ENCOUNTER — Ambulatory Visit: Admitting: Physician Assistant

## 2024-01-26 ENCOUNTER — Encounter: Payer: Self-pay | Admitting: Obstetrics and Gynecology

## 2024-01-26 ENCOUNTER — Ambulatory Visit: Admitting: Obstetrics and Gynecology

## 2024-02-06 ENCOUNTER — Other Ambulatory Visit: Payer: Self-pay | Admitting: Physician Assistant

## 2024-02-06 DIAGNOSIS — I10 Essential (primary) hypertension: Secondary | ICD-10-CM

## 2024-03-09 ENCOUNTER — Other Ambulatory Visit: Payer: Self-pay | Admitting: Physician Assistant

## 2024-03-09 DIAGNOSIS — I10 Essential (primary) hypertension: Secondary | ICD-10-CM

## 2024-04-21 ENCOUNTER — Other Ambulatory Visit: Payer: Self-pay | Admitting: Physician Assistant

## 2024-04-21 DIAGNOSIS — S39012A Strain of muscle, fascia and tendon of lower back, initial encounter: Secondary | ICD-10-CM

## 2024-05-10 DIAGNOSIS — H18513 Endothelial corneal dystrophy, bilateral: Secondary | ICD-10-CM | POA: Diagnosis not present

## 2024-05-10 DIAGNOSIS — H40033 Anatomical narrow angle, bilateral: Secondary | ICD-10-CM | POA: Diagnosis not present

## 2024-05-10 DIAGNOSIS — H353121 Nonexudative age-related macular degeneration, left eye, early dry stage: Secondary | ICD-10-CM | POA: Diagnosis not present

## 2024-05-10 DIAGNOSIS — H2513 Age-related nuclear cataract, bilateral: Secondary | ICD-10-CM | POA: Diagnosis not present

## 2024-06-10 ENCOUNTER — Encounter: Payer: Self-pay | Admitting: Internal Medicine

## 2024-06-10 ENCOUNTER — Telehealth: Payer: Self-pay | Admitting: *Deleted

## 2024-06-10 ENCOUNTER — Other Ambulatory Visit: Payer: Self-pay | Admitting: Medical

## 2024-06-10 DIAGNOSIS — Z1231 Encounter for screening mammogram for malignant neoplasm of breast: Secondary | ICD-10-CM

## 2024-06-10 NOTE — Telephone Encounter (Signed)
 Copied from CRM [id number]. Topic: Appointments - Transfer of Care >> Jun 10, 2024  8:15 AM Pinkey ORN wrote: Pt is requesting to transfer FROM: [facility] Pt is requesting to transfer TO: [facility] [place] Reason for requested transfer: Previous PCP has left the practice.  It is the responsibility of the team the patient would like to transfer to (Dr.  Tully CINDERELLA Theophilus Delma, MD) to reach out to the patient if for any reason this transfer is not acceptable.

## 2024-06-21 ENCOUNTER — Ambulatory Visit
Admission: RE | Admit: 2024-06-21 | Discharge: 2024-06-21 | Disposition: A | Discharge status: Home / Self Care | Source: Ambulatory Visit | Attending: Medical | Admitting: Medical

## 2024-06-21 DIAGNOSIS — Z1231 Encounter for screening mammogram for malignant neoplasm of breast: Secondary | ICD-10-CM

## 2024-07-21 ENCOUNTER — Encounter: Admitting: Internal Medicine

## 2024-08-01 ENCOUNTER — Encounter: Payer: Self-pay | Admitting: Internal Medicine

## 2024-08-01 ENCOUNTER — Ambulatory Visit: Admitting: Internal Medicine

## 2024-08-01 VITALS — BP 102/70 | HR 75 | Temp 98.0°F | Ht 62.0 in | Wt 176.7 lb

## 2024-08-01 DIAGNOSIS — F17211 Nicotine dependence, cigarettes, in remission: Secondary | ICD-10-CM

## 2024-08-01 DIAGNOSIS — S39012S Strain of muscle, fascia and tendon of lower back, sequela: Secondary | ICD-10-CM

## 2024-08-01 DIAGNOSIS — F418 Other specified anxiety disorders: Secondary | ICD-10-CM | POA: Diagnosis not present

## 2024-08-01 DIAGNOSIS — F329 Major depressive disorder, single episode, unspecified: Secondary | ICD-10-CM | POA: Diagnosis not present

## 2024-08-01 DIAGNOSIS — Z23 Encounter for immunization: Secondary | ICD-10-CM

## 2024-08-01 DIAGNOSIS — I10 Essential (primary) hypertension: Secondary | ICD-10-CM

## 2024-08-01 MED ORDER — LISINOPRIL 10 MG PO TABS: 10.0000 mg | ORAL_TABLET | Freq: Every day | ORAL | 1 refills | Status: AC

## 2024-08-01 MED ORDER — BACLOFEN 10 MG PO TABS: 10.0000 mg | ORAL_TABLET | Freq: Three times a day (TID) | ORAL | 1 refills | Status: DC

## 2024-08-01 MED ORDER — DIAZEPAM 2 MG PO TABS
2.0000 mg | ORAL_TABLET | Freq: Two times a day (BID) | ORAL | 1 refills | Status: AC | PRN
Start: 2024-08-01 — End: ?

## 2024-08-01 MED ORDER — CELECOXIB 100 MG PO CAPS
100.0000 mg | ORAL_CAPSULE | Freq: Two times a day (BID) | ORAL | 2 refills | Status: AC
Start: 2024-08-01 — End: ?

## 2024-08-01 MED ORDER — HYDROCHLOROTHIAZIDE 12.5 MG PO CAPS: 12.5000 mg | ORAL_CAPSULE | Freq: Every day | ORAL | 1 refills | Status: AC

## 2024-08-01 NOTE — Addendum Note (Signed)
 Addended by: KATHRYNE MILLMAN B on: 08/01/2024 02:13 PM   Modules accepted: Orders

## 2024-08-01 NOTE — Progress Notes (Signed)
 New Patient Office Visit     CC/Reason for Visit: Establish care, discuss chronic conditions, medication refills Previous PCP: Morna Flatness, PA Last Visit: April 2025  HPI: Melanie Montgomery is a 63 y.o. female who is coming in today for the above mentioned reasons. Past Medical History is significant for: Generalized anxiety disorder and depression, hypertension.  Former smoker who quit in 2014.  No alcohol use, no allergies, past surgical history significant for splenectomy and 3 C-sections.  She has a strong family history of coronary artery disease with father 2 brothers and a sister all with heart issues.  One of her brothers died from a fatal MI at age 81.  Main of her family members were diabetic and hyperlipidemic as well as hypertensive.  Due for COVID, flu, pneumonia, shingles vaccines.  Cancer screening is up-to-date with the exception of lung cancer screening due to her smoking history.   Past Medical/Surgical History: Past Medical History:  Diagnosis Date   Anemia    Anxiety    Blood transfusion without reported diagnosis    Cataract    Depression    Family history of brain cancer    Family history of breast cancer    Family history of colon cancer    Family history of esophageal cancer    Family history of lung cancer    Family history of stomach cancer    Family history of testicular cancer    Hypertension    Hypoglycemia     Past Surgical History:  Procedure Laterality Date   CESAREAN SECTION     FRACTURE SURGERY     RADIOACTIVE SEED GUIDED EXCISIONAL BREAST BIOPSY Right 04/25/2021   Procedure: RADIOACTIVE SEED GUIDED EXCISIONAL RIGHT BREAST BIOPSY;  Surgeon: Aron Shoulders, MD;  Location: [facility];  Service: General;  Laterality: Right;   SPLENECTOMY, TOTAL  1987   TUBAL LIGATION      Social History:  reports that she quit smoking about 11 years ago. Her smoking use included cigarettes. She started smoking about 42 years ago. She  has a 62.7 pack-year smoking history. She has never used smokeless tobacco. She reports that she does not drink alcohol and does not use drugs.  Allergies: No Known Allergies  Family History:  Family History  Problem Relation Age of Onset   Cancer Mother    Diabetes Mother    Breast cancer Mother 37   Heart disease Father    Cancer Sister    Diabetes Sister    Colon cancer Sister 59   Diabetes Sister    Heart failure Sister    Diabetes Sister    Diabetes Brother    Heart disease Brother    Heart attack Brother    Heart attack Brother 59   Breast cancer Maternal Grandmother        passed at 78   Cancer Maternal Grandfather        unknown cauliflower cancer on his side   Breast cancer Maternal Aunt        dx 34s   Lung cancer Maternal Aunt        hx smoking   Esophageal cancer Maternal Uncle    Stomach cancer Paternal Aunt        dx <50   Brain cancer Paternal Uncle        died 59s   Cirrhosis Paternal Uncle    Breast cancer Cousin        dx 94s (maternal first cousin)  Breast cancer Cousin        dx <50 (maternal first cousin)   Brain cancer Cousin        dx 44s (maternal first cousin)   Cancer Cousin 50       oral cancer (maternal first cousin)   Cancer Cousin 45       unknown cancer (maternal first cousin)   Testicular cancer Nephew 21   Rectal cancer Neg Hx      Current Outpatient Medications:    Multiple Vitamin (MULTIVITAMIN) tablet, Take 1 tablet by mouth daily., Disp: , Rfl:    baclofen  (LIORESAL ) 10 MG tablet, Take 1 tablet (10 mg total) by mouth 3 (three) times daily., Disp: 90 tablet, Rfl: 1   celecoxib  (CELEBREX ) 100 MG capsule, Take 1 capsule (100 mg total) by mouth 2 (two) times daily., Disp: 60 capsule, Rfl: 2   diazepam  (VALIUM ) 2 MG tablet, Take 1 tablet (2 mg total) by mouth every 12 (twelve) hours as needed. for anxiety, Disp: 60 tablet, Rfl: 1   hydrochlorothiazide  (MICROZIDE ) 12.5 MG capsule, Take 1 capsule (12.5 mg total) by mouth daily.,  Disp: 90 capsule, Rfl: 1   lisinopril  (ZESTRIL ) 10 MG tablet, Take 1 tablet (10 mg total) by mouth daily., Disp: 90 tablet, Rfl: 1  Review of Systems:  Negative except as indicated in HPI.   Physical Exam: Vitals:   08/01/24 1337  BP: 102/70  Pulse: 75  Temp: 98 F (36.7 C)  TempSrc: Oral  SpO2: 98%  Weight: 176 lb 11.2 oz (80.2 kg)  Height: 5' 2 (1.575 m)   Body mass index is 32.32 kg/m.  Physical Exam Vitals reviewed.  Constitutional:      Appearance: Normal appearance. She is obese.  HENT:     Head: Normocephalic and atraumatic.  Eyes:     Conjunctiva/sclera: Conjunctivae normal.  Cardiovascular:     Rate and Rhythm: Normal rate and regular rhythm.  Pulmonary:     Effort: Pulmonary effort is normal.     Breath sounds: Normal breath sounds.  Skin:    General: Skin is warm and dry.  Neurological:     General: No focal deficit present.     Mental Status: She is alert and oriented to person, place, and time.  Psychiatric:        Mood and Affect: Mood normal.        Behavior: Behavior normal.        Thought Content: Thought content normal.        Judgment: Judgment normal.       Impression and Plan:  Reactive depression  Strain of lumbar region, sequela -     Baclofen ; Take 1 tablet (10 mg total) by mouth 3 (three) times daily.  Dispense: 90 tablet; Refill: 1 -     Celecoxib ; Take 1 capsule (100 mg total) by mouth 2 (two) times daily.  Dispense: 60 capsule; Refill: 2  Situational anxiety -     diazePAM ; Take 1 tablet (2 mg total) by mouth every 12 (twelve) hours as needed. for anxiety  Dispense: 60 tablet; Refill: 1  Primary hypertension -     hydroCHLOROthiazide ; Take 1 capsule (12.5 mg total) by mouth daily.  Dispense: 90 capsule; Refill: 1 -     Lisinopril ; Take 1 tablet (10 mg total) by mouth daily.  Dispense: 90 tablet; Refill: 1  Immunization due  Cigarette nicotine dependence in remission -     Ambulatory Referral for Lung Cancer Scre   -  First shingles vaccine administered in office today. - Blood pressure is well-controlled, refill lisinopril  and hydrochlorothiazide . - Will send for low-dose lung CT scan given her history of smoking. - Schedule follow-up for physical with labs.  Time spent: 46 minutes reviewing chart, interviewing and examining patient and formulating plan of care.       Tully Theophilus Andrews, MD [facility] at [facility]

## 2024-08-09 ENCOUNTER — Other Ambulatory Visit: Payer: Self-pay

## 2024-08-09 ENCOUNTER — Telehealth: Payer: Self-pay

## 2024-08-09 DIAGNOSIS — Z122 Encounter for screening for malignant neoplasm of respiratory organs: Secondary | ICD-10-CM

## 2024-08-09 DIAGNOSIS — Z87891 Personal history of nicotine dependence: Secondary | ICD-10-CM

## 2024-08-09 NOTE — Telephone Encounter (Signed)
.  Lung Cancer Screening Narrative/Criteria Questionnaire (Cigarette Smokers Only- No Cigars/Pipes/vapes)   [name] [name]   SDMV:08/24/2024 at 11:45 am Melanie Montgomery       Oct 15, 1960                           LDCT: 08/29/2024 at 4:00 pm GI    63 y.o.   Phone: [phone]  Lung Screening Narrative (confirm age 53-77 yrs Medicare / 50-80 yrs Private pay insurance)   Insurance information: Community Education Officer   Referring Provider: Delma, MD   This screening involves an initial phone call with a team member from our program. It is called a shared decision making visit. The initial meeting is required by  insurance and Medicare to make sure you understand the program. This appointment takes about 15-20 minutes to complete. You will complete the screening scan at your scheduled date/time.  This scan takes about 5-10 minutes to complete. You can eat and drink normally before and after the scan.  Criteria questions for Lung Cancer Screening:   Are you a current or former smoker? Former Age began smoking: 21   If you are a former smoker, what year did you quit smoking? Quit 2014 (within 15 yrs)   To calculate your smoking history, I need an accurate estimate of how many packs of cigarettes you smoked per day and for how many years. (Not just the number of PPD you are now smoking)   Years smoking 31 x Packs per day 2.5 = Pack years 77.5   (at least 20 pack yrs)   (Make sure they understand that we need to know how much they have smoked in the past, not just the number of PPD they are smoking now)  Do you have a personal history of cancer?  No    Do you have a family history of cancer? Yes  (cancer type and and relative) Mother had breast. Sister at colon cancer.   Are you coughing up blood?  No  Have you had unexplained weight loss of 15 lbs or more in the last 6 months? No  It looks like you meet all criteria.  When would be a good time for us  to schedule you for this screening?   Additional  information: N/A

## 2024-08-24 ENCOUNTER — Encounter: Payer: Self-pay | Admitting: Adult Health

## 2024-08-24 ENCOUNTER — Ambulatory Visit: Admitting: Adult Health

## 2024-08-24 DIAGNOSIS — Z87891 Personal history of nicotine dependence: Secondary | ICD-10-CM | POA: Diagnosis not present

## 2024-08-24 NOTE — Patient Instructions (Signed)

## 2024-08-24 NOTE — Progress Notes (Signed)
°  Virtual Visit via Telephone Note  I connected with Melanie Montgomery , 08/24/2024 11:38 AM by a telemedicine application and verified that I am speaking with the correct person using two identifiers.  Location: Patient: home Provider: home   I discussed the limitations of evaluation and management by telemedicine and the availability of in person appointments. The patient expressed understanding and agreed to proceed.   Shared Decision Making Visit Lung Cancer Screening Program [phone])   Eligibility: 63 y.o. Pack Years Smoking History Calculation = 77.5 pack years  (# packs/per year x # years smoked) Recent History of coughing up blood  no Unexplained weight loss? no ( >Than 15 pounds within the last 6 months ) Prior History Lung / other cancer no (Diagnosis within the last 5 years already requiring surveillance chest CT Scans). Smoking Status Former Smoker Former Smokers: Years since quit: 11 years  Quit Date: 2014  Visit Components: Discussion included one or more decision making aids. YES Discussion included risk/benefits of screening. YES Discussion included potential follow up diagnostic testing for abnormal scans. YES Discussion included meaning and risk of over diagnosis. YES Discussion included meaning and risk of False Positives. YES Discussion included meaning of total radiation exposure. YES  Counseling Included: Importance of adherence to annual lung cancer LDCT screening. YES Impact of comorbidities on ability to participate in the program. YES Ability and willingness to under diagnostic treatment. YES  Smoking Cessation Counseling: Former Smokers:  Discussed the importance of maintaining cigarette abstinence. yes Diagnosis Code: Personal History of Nicotine Dependence. S12.108 Information about tobacco cessation classes and interventions provided to patient. Yes Patient provided with ticket for LDCT Scan. yes Written Order for Lung Cancer  Screening with LDCT placed in Epic. Yes (CT Chest Lung Cancer Screening Low Dose W/O CM) [id number]  Z12.2-Screening of respiratory organs Z87.891-Personal history of nicotine dependence   [name] 08/24/2024

## 2024-08-25 ENCOUNTER — Other Ambulatory Visit: Payer: Self-pay | Admitting: Internal Medicine

## 2024-08-25 DIAGNOSIS — S39012S Strain of muscle, fascia and tendon of lower back, sequela: Secondary | ICD-10-CM

## 2024-08-29 ENCOUNTER — Inpatient Hospital Stay: Admission: RE | Admit: 2024-08-29 | Discharge: 2024-08-29 | Attending: Acute Care | Admitting: Acute Care

## 2024-08-29 DIAGNOSIS — Z87891 Personal history of nicotine dependence: Secondary | ICD-10-CM

## 2024-08-29 DIAGNOSIS — Z122 Encounter for screening for malignant neoplasm of respiratory organs: Secondary | ICD-10-CM

## 2024-09-12 ENCOUNTER — Telehealth: Payer: Self-pay | Admitting: Acute Care

## 2024-09-12 DIAGNOSIS — R911 Solitary pulmonary nodule: Secondary | ICD-10-CM

## 2024-09-12 NOTE — Telephone Encounter (Signed)
 LVM to call office and review recent Lung CT results.

## 2024-09-12 NOTE — Telephone Encounter (Signed)
 This patient has a significant family history of cancer.  She is also a heavy smoker.  Pulmonary nodule has grown slowly over the last 5 years.  Concerning for a slow-growing bronchogenic carcinoma.  Plan will be for 68-month follow-up low-dose CT to reevaluate at a short interval to see if the nodule is more PET avid for further diagnostic evaluation.  Please fax results to PCP, let them know plan of care.  We will plan for a 8-month follow-up low-dose CT which will be due in 02/25/2025.  Patient does also have notation of hepatic steatosis. Hepatic steatosis. This  is a term that describes the build up of fat in the liver. It is normal to have small amounts of fat in your liver, but when the proportion of liver cells that contain fat exceeds more than 5% it is indicative of early stage fatty liver.Treatment often involves reducing risk factors through a diet and exercise plan. It is generally a benign condition, but in a small percentage of patients it does require follow up. Please have the patient follow up with PCP regarding potential risk factor modification, dietary therapy or pharmacologic therapy if clinically indicated.  Thanks so much

## 2024-09-13 NOTE — Addendum Note (Signed)
 Addended by: RHETT KELLY POUR on: 09/13/2024 09:28 AM   Modules accepted: Orders

## 2024-09-13 NOTE — Telephone Encounter (Signed)
 Called and spoke to pt. Informed her of the results of the LDCT. We discussed the slow growth of one of her nodules and the recommendation for a 6 month follow up scan to re-evaluate. Patient is agreeable to short term scan. We also discussed the noted hepatic steatosis. Patient aware we will send results and plan to PCP. LDCT scan ordered for 6 months. Pt verbalized understanding and denied any further questions or concerns at this time.

## 2024-11-15 ENCOUNTER — Encounter: Admitting: Internal Medicine
